# Patient Record
Sex: Female | Born: 1995
Health system: Southern US, Community
[De-identification: ages and names within clinical notes are randomized; demographics above are authoritative.]

## PROBLEM LIST (undated history)

## (undated) HISTORY — PX: TONSILLECTOMY: SUR1361

---
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
## 2015-03-12 ENCOUNTER — Other Ambulatory Visit: Payer: Self-pay | Admitting: *Deleted

## 2015-05-22 ENCOUNTER — Telehealth: Payer: Self-pay | Admitting: *Deleted

## 2015-05-22 DIAGNOSIS — Z3041 Encounter for surveillance of contraceptive pills: Secondary | ICD-10-CM

## 2015-05-22 MED ORDER — NORGESTIM-ETH ESTRAD TRIPHASIC 0.18/0.215/0.25 MG-35 MCG PO TABS: 1.0000 | ORAL_TABLET | Freq: Every day | ORAL | Status: DC

## 2015-05-22 NOTE — Telephone Encounter (Signed)
Patient is requesting a refill of Norgestimate-Ethinyl Estradiol Triphasic (TRI-SPRINTEC) 0.18/0.215/0.25 MG-35 MCG tablet [facility]

## 2015-05-22 NOTE — Telephone Encounter (Signed)
Rx done. 

## 2015-10-04 ENCOUNTER — Other Ambulatory Visit: Payer: Self-pay | Admitting: Family Medicine

## 2015-11-23 ENCOUNTER — Ambulatory Visit: Payer: Managed Care, Other (non HMO) | Admitting: Adult Health

## 2016-01-17 ENCOUNTER — Other Ambulatory Visit: Payer: Self-pay | Admitting: Family Medicine

## 2016-02-19 ENCOUNTER — Encounter: Payer: Self-pay | Admitting: Family Medicine

## 2016-02-19 ENCOUNTER — Ambulatory Visit (INDEPENDENT_AMBULATORY_CARE_PROVIDER_SITE_OTHER): Payer: Managed Care, Other (non HMO) | Admitting: Family Medicine

## 2016-02-19 VITALS — BP 90/62 | HR 114 | Temp 97.7°F | Ht 66.09 in | Wt 138.1 lb

## 2016-02-19 DIAGNOSIS — Z3041 Encounter for surveillance of contraceptive pills: Secondary | ICD-10-CM | POA: Diagnosis not present

## 2016-02-19 DIAGNOSIS — M79662 Pain in left lower leg: Secondary | ICD-10-CM

## 2016-02-19 MED ORDER — NORGESTIM-ETH ESTRAD TRIPHASIC 0.18/0.215/0.25 MG-35 MCG PO TABS: ORAL_TABLET | ORAL | Status: DC

## 2016-02-19 NOTE — Progress Notes (Signed)
  HPI:  Here for 1 year follow up. 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 Only complaint is shin pain and needs refills on OCP (see below).  Contraception Management: -doing well, taking pill daily at the same time -has not missed doses -denies: ha, HTN, concern for STI, reaction to medication -normal regular periods, FDLMP: regular, monthly -uses condoms every time, one partner, declines STI testing - no new partners -hpv vaccine: done  L shin pain: -mechanical fall on stairs a few weeks ago -bruised upper L shin with some bruising and pain initially -mostly resolve but has small bump here that is tender to press on -no pain with ambulation (now or initially), pain without pressing, swelling  ROS: See pertinent positives and negatives per HPI.  No past medical history on file.  Past Surgical History  Procedure Laterality Date  . Tonsillectomy      Family History  Problem Relation Age of Onset  . Mental retardation Father   . Hyperlipidemia Maternal Grandfather     Social History   Social History  . Marital Status: Single    Spouse Name: N/A  . Number of Children: N/A  . Years of Education: N/A   Social History Main Topics  . Smoking status: Never Smoker   . Smokeless tobacco: None  . Alcohol Use: No  . Drug Use: No  . Sexual Activity: Not Asked   Other Topics Concern  . None   Social History Narrative   Work or School: [employer], 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      Home Situation: lives with mom, dad and sister      Spiritual Beliefs: Christian      Lifestyle: dances daily; healthy diet              Current outpatient prescriptions:  .  Norgestimate-Ethinyl Estradiol Triphasic 0.18/0.215/0.25 MG-35 MCG tablet, Take 1 tablet by mouth  daily, Disp: 84 tablet, Rfl: 3  EXAM:  Filed Vitals:   02/19/16 1342  BP: 90/62  Pulse: 114  Temp: 97.7 F (36.5 C)    Body mass index is 22.22  kg/(m^2).  GENERAL: vitals reviewed and listed above, alert, oriented, appears well hydrated and in no acute distress  HEENT: atraumatic, conjunttiva clear, no obvious abnormalities on inspection of external nose and ears  NECK: no obvious masses on inspection  LUNGS: clear to auscultation bilaterally, no wheezes, rales or rhonchi, good air movement  CV: HRRR, no peripheral edema  MS: moves all extremities without noticeable abnormality; normal gait, very small area of hyperpigmentation of the skin on the superior portion of the left shin, very small subcutaneous nodule at this point, no bony deformity or tenderness, no swelling or erythema, normal exam otherwise  PSYCH: pleasant and cooperative, no obvious depression or anxiety  ASSESSMENT AND PLAN:  Discussed the following assessment and plan:  Encounter for surveillance of contraceptive pills  Pain in shin, left  -refilled OCP -discussed safe sexual practices and rationale, risks  -shin findings likely post contusion, fx unlikely so we opted to observe since pretty much asymptomatic, follow up prn -Follow up yearly and as needed -Patient advised to return or notify a doctor immediately if symptoms worsen or persist or new concerns arise.  There are no Patient Instructions on file for this visit.   Kriste BasqueKIM, Michaelanthony Kempton R.

## 2016-02-19 NOTE — Progress Notes (Signed)
Pre visit review using our clinic review tool, if applicable. No additional management support is needed unless otherwise documented below in the visit note. 

## 2016-05-12 ENCOUNTER — Ambulatory Visit (INDEPENDENT_AMBULATORY_CARE_PROVIDER_SITE_OTHER): Payer: Managed Care, Other (non HMO) | Admitting: Family Medicine

## 2016-05-12 ENCOUNTER — Encounter: Payer: Self-pay | Admitting: Family Medicine

## 2016-05-12 VITALS — BP 96/70 | HR 104 | Ht 66.1 in | Wt 143.1 lb

## 2016-05-12 DIAGNOSIS — R300 Dysuria: Secondary | ICD-10-CM

## 2016-05-12 DIAGNOSIS — J309 Allergic rhinitis, unspecified: Secondary | ICD-10-CM

## 2016-05-12 LAB — POCT URINALYSIS DIPSTICK
Blood, UA: NEGATIVE
Glucose, UA: NEGATIVE
Ketones, UA: NEGATIVE
NITRITE UA: POSITIVE
PH UA: 6
PROTEIN UA: NEGATIVE
Spec Grav, UA: >=1.03
UROBILINOGEN UA: 4

## 2016-05-12 MED ORDER — NITROFURANTOIN MONOHYD MACRO 100 MG PO CAPS: 100.0000 mg | ORAL_CAPSULE | Freq: Two times a day (BID) | ORAL | 0 refills | Status: DC

## 2016-05-12 NOTE — Patient Instructions (Addendum)
  FOR THE ALLERGIES: -zyrtec nightly -flonase 2 sprays each nostril daily for 1 month and then can do 1 spray each nostril daily -follow up in 1 month if symptoms persist, sooner if concers  Start  the antibiotic for the urinary symptoms. Follow up with your gynecologist as planned. Follow up here if persistent symptoms.

## 2016-05-12 NOTE — Progress Notes (Signed)
  HPI:  Acute visit for several issues:  1) nasal congestion: -intermittent chronically for > 1 year -other symptoms: itchy eyes, sneezing, PND -denies: fevers, thick mucus, sinus pain, wheezing, COB  2)dysuria: -for 3 days -symptoms include frequency, urgency -denies vaginal symptoms, fevers, flank pain, nausea, vomiting -hx UTI and BV -seeing gyn in 3 days  FDLMP: 04/27/16   ROS: See pertinent positives and negatives per HPI.  No past medical history on file.  Past Surgical History:  Procedure Laterality Date  . TONSILLECTOMY      Family History  Problem Relation Age of Onset  . Mental retardation Father   . Hyperlipidemia Maternal Grandfather     Social History   Social History  . Marital status: Single    Spouse name: N/A  . Number of children: N/A  . Years of education: N/A   Social History Main Topics  . Smoking status: Never Smoker  . Smokeless tobacco: None  . Alcohol use No  . Drug use: No  . Sexual activity: Not Asked   Other Topics Concern  . None   Social History Narrative   Work or School: [employer], 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      Home Situation: lives with mom, dad and sister      Spiritual Beliefs: Christian      Lifestyle: dances daily; healthy diet              Current Outpatient Prescriptions:  .  Norgestimate-Ethinyl Estradiol Triphasic 0.18/0.215/0.25 MG-35 MCG tablet, Take 1 tablet by mouth  daily, Disp: 84 tablet, Rfl: 3 .  Phenazopyridine HCl (AZO TABS PO), Take by mouth., Disp: , Rfl:  .  nitrofurantoin, macrocrystal-monohydrate, (MACROBID) 100 MG capsule, Take 1 capsule (100 mg total) by mouth 2 (two) times daily., Disp: 14 capsule, Rfl: 0  EXAM:  Vitals:   05/12/16 1356  BP: 96/70  Pulse: (!) 104    Body mass index is 23.03 kg/m.  GENERAL: vitals reviewed and listed above, alert, oriented, appears well hydrated and in no acute distress  HEENT: atraumatic, conjunttiva clear, no  obvious abnormalities on inspection of external nose and ears, normal appearance of ear canals and TMs, clear nasal congestion, boggy turbinates, mild post oropharyngeal erythema with PND, no tonsillar edema or exudate, no sinus TTP  NECK: no obvious masses on inspection  LUNGS: clear to auscultation bilaterally, no wheezes, rales or rhonchi, good air movement  ABD: BS+, soft, NTTP, no CVA TTP  CV: HRRR, no peripheral edema  MS: moves all extremities without noticeable abnormality  PSYCH: pleasant and cooperative, no obvious depression or anxiety  ASSESSMENT AND PLAN:  Discussed the following assessment and plan:  Allergic rhinitis, unspecified allergic rhinitis type  Dysuria - Plan: POC Urinalysis Dipstick  -allergy tx per instructions -opted for macrobid for UTI after discussion risks/alt/return precuations -Patient advised to return or notify a doctor immediately if symptoms worsen or persist or new concerns arise.  Patient Instructions   FOR THE ALLERGIES: -zyrtec nightly -flonase 2 sprays each nostril daily for 1 month and then can do 1 spray each nostril daily -follow up in 1 month if symptoms persist, sooner if concers  Start  the antibiotic for the urinary symptoms. Follow up with your gynecologist as planned. Follow up here if persistent symptoms.    Kriste BasqueKIM, Mickenzie Stolar R., DO

## 2016-05-12 NOTE — Progress Notes (Signed)
Pre visit review using our clinic review tool, if applicable. No additional management support is needed unless otherwise documented below in the visit note. 

## 2016-06-19 ENCOUNTER — Other Ambulatory Visit: Payer: Self-pay | Admitting: Family Medicine

## 2017-02-27 ENCOUNTER — Ambulatory Visit (INDEPENDENT_AMBULATORY_CARE_PROVIDER_SITE_OTHER): Payer: Managed Care, Other (non HMO) | Admitting: Family Medicine

## 2017-02-27 ENCOUNTER — Encounter: Payer: Managed Care, Other (non HMO) | Admitting: Family Medicine

## 2017-02-27 ENCOUNTER — Encounter: Payer: Self-pay | Admitting: Family Medicine

## 2017-02-27 VITALS — BP 88/60 | HR 85 | Temp 97.6°F | Ht 66.25 in | Wt 132.6 lb

## 2017-02-27 DIAGNOSIS — Z0000 Encounter for general adult medical examination without abnormal findings: Secondary | ICD-10-CM

## 2017-02-27 DIAGNOSIS — Z111 Encounter for screening for respiratory tuberculosis: Secondary | ICD-10-CM

## 2017-02-27 NOTE — Progress Notes (Signed)
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 No tb exposure risks or symptoms.  -Diet: variety of foods, balance and well rounded  -Exercise: regular exercise  -Taking folic acid, vitamin D or calcium: no  -Diabetes and Dyslipidemia Screening:n/a  -Hx of HTN: no  -Vaccines: UTD  -pap history: n/a - sees gyn   -wants STI testing (Hep C if born [birth year]): no  -FH breast, colon or ovarian ca: see FH Last mammogram: Last colon cancer screening:  -Alcohol, Tobacco, drug use: see social history  Review of Systems - no fevers, unintentional weight loss, vision loss, hearing loss, chest pain, sob, hemoptysis, melena, hematochezia, hematuria, genital discharge, changing or concerning skin lesions, bleeding, bruising, loc, thoughts of self harm or SI  No past medical history on file.  Past Surgical History:  Procedure Laterality Date  . TONSILLECTOMY      Family History  Problem Relation Age of Onset  . Mental retardation Father   . Hyperlipidemia Maternal Grandfather     Social History   Social History  . Marital status: Single    Spouse name: N/A  . Number of children: N/A  . Years of education: N/A   Social History Main Topics  . Smoking status: Never Smoker  . Smokeless tobacco: Never Used  . Alcohol use No  . Drug use: No  . Sexual activity: Not Asked   Other Topics Concern  . 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  etonogestrel (NEXPLANON) 68 MG IMPL implant, 1 each by Subdermal route once. Implanted 05/22/2016 by [facility], Disp: , Rfl:   EXAM:  Vitals:   02/27/17 0916  BP: (!) 88/60  Pulse: 85   Temp: 97.6 F (36.4 C)    GENERAL: vitals reviewed and listed below, alert, oriented, appears well hydrated and in no acute distress  HEENT: head atraumatic, PERRLA, normal appearance of eyes, ears, nose and mouth. moist mucus membranes.  NECK: supple, no masses or lymphadenopathy  LUNGS: clear to auscultation bilaterally, no rales, rhonchi or wheeze  CV: HRRR, no peripheral edema or cyanosis, normal pedal pulses  ABDOMEN: bowel sounds normal, soft, non tender to palpation, no masses, no rebound or guarding  SKIN: no rash or abnormal lesions  GYN/BREAST: declined  MS: normal gait, moves all extremities normally  NEURO: normal gait, speech and thought processing grossly intact, muscle tone grossly intact throughout  PSYCH: normal affect, pleasant and cooperative  ASSESSMENT AND PLAN:  Discussed the following assessment and plan:  Screening-pulmonary TB - Plan: Quantiferon tb gold assay  Encounter for preventive health examination  -forms given to assistant to copy for patient  -she opted for quant gold TB screening as can not return for skin test  -Discussed and advised all [place] preventive services health task force level A and B recommendations for age, sex and risks.  -Advised at least 150 minutes of exercise per week and a healthy diet with avoidance of (less then 1 serving per week) processed foods, white starches, red meat, fast foods and sweets and  consisting of: * 5-9 servings of fresh fruits and vegetables (not corn or potatoes) *nuts and seeds, beans *olives and olive oil *lean meats such as fish and white chicken  *whole grains  -labs, studies and vaccines per orders this encounter  Orders Placed This Encounter  Procedures  . Quantiferon tb gold assay    Patient advised to return to clinic immediately if symptoms worsen or persist or new concerns.  Patient Instructions  BEFORE YOU LEAVE: -lab for TB test -vaccine record and forms -follow up: yearly  for CPE  We have ordered labs or studies at this visit. It can take up to 1-2 weeks for results and processing. IF results require follow up or explanation, we will call you with instructions. Clinically stable results will be released to your [facility]MYCHART. If you have not heard from us or cannot find your results in [facility] in 2 weeks please contact our office at [phone].  If you are not yet signed up for [facility], please consider signing up.   We recommend the following healthy lifestyle for LIFE:  Eat a healthy clean diet.  * Tip: Avoid (less then 1 serving per week): processed foods, sweets, sweetened drinks, white starches (rice, flour, bread, potatoes, pasta, etc), red meat, fast foods, butter  *Tip: CHOOSE instead   * 5-9 servings per day of fresh or frozen fruits and vegetables (but not corn, potatoes, bananas, canned or dried fruit)   *nuts and seeds, beans   *olives and olive oil   *small portions of lean meats such as fish and white chicken    *small portions of whole grains  Get at least 150 minutes of sweaty aerobic exercise per week.  Reduce stress - consider counseling, meditation and relaxation to balance other aspects of your life.         No Follow-up on file.  Kriste BasqueKIM, Demarko Zeimet R., DO

## 2017-02-27 NOTE — Patient Instructions (Signed)
BEFORE YOU LEAVE: -lab for TB test -vaccine record and forms -follow up: yearly for CPE  We have ordered labs or studies at this visit. It can take up to 1-2 weeks for results and processing. IF results require follow up or explanation, we will call you with instructions. Clinically stable results will be released to your [facility]. If you have not heard from us or cannot find your results in [facility] in 2 weeks please contact our office at [phone].  If you are not yet signed up for [facility], please consider signing up.   We recommend the following healthy lifestyle for LIFE:  Eat a healthy clean diet.  * Tip: Avoid (less then 1 serving per week): processed foods, sweets, sweetened drinks, white starches (rice, flour, bread, potatoes, pasta, etc), red meat, fast foods, butter  *Tip: CHOOSE instead   * 5-9 servings per day of fresh or frozen fruits and vegetables (but not corn, potatoes, bananas, canned or dried fruit)   *nuts and seeds, beans   *olives and olive oil   *small portions of lean meats such as fish and white chicken    *small portions of whole grains  Get at least 150 minutes of sweaty aerobic exercise per week.  Reduce stress - consider counseling, meditation and relaxation to balance other aspects of your life.

## 2017-03-03 LAB — QUANTIFERON TB GOLD ASSAY (BLOOD)
INTERFERON GAMMA RELEASE ASSAY: NEGATIVE
Mitogen-Nil: 9.83 IU/mL
Quantiferon Nil Value: 0.04 IU/mL

## 2017-06-25 ENCOUNTER — Encounter: Payer: Self-pay | Admitting: Family Medicine

## 2017-10-15 ENCOUNTER — Encounter: Payer: Self-pay | Admitting: Family Medicine

## 2019-05-24 ENCOUNTER — Other Ambulatory Visit: Payer: Self-pay

## 2019-05-24 ENCOUNTER — Telehealth (INDEPENDENT_AMBULATORY_CARE_PROVIDER_SITE_OTHER): Payer: Managed Care, Other (non HMO) | Admitting: Family Medicine

## 2019-05-24 DIAGNOSIS — R21 Rash and other nonspecific skin eruption: Secondary | ICD-10-CM

## 2019-05-24 MED ORDER — TRIAMCINOLONE ACETONIDE 0.1 % EX CREA: 1.0000 "application " | TOPICAL_CREAM | Freq: Two times a day (BID) | CUTANEOUS | 0 refills | Status: DC

## 2019-05-24 MED ORDER — VALACYCLOVIR HCL 1 G PO TABS: 1000.0000 mg | ORAL_TABLET | Freq: Every day | ORAL | 0 refills | Status: DC

## 2019-05-24 NOTE — Patient Instructions (Signed)
-  gentle skin care - use hypoallergenic soap, detergent, etc and avoid heat and sun exposure if possible  -use Aquaphor twice daily mix with a little of the triamcinilone cream for 2 weeks.  I hope you are feeling better soon! Seek care promptly if your symptoms worsen, new concerns arise or you are not improving with treatment over the next week or so.

## 2019-05-24 NOTE — Progress Notes (Signed)
Virtual Visit via Video Note  I connected with Gina Sanders  on 05/24/19 at  3:00 PM EDT by a video enabled telemedicine application and verified that I am speaking with the correct person using two identifiers.  Location patient: home Location provider:work or home office Persons participating in the virtual visit: patient, provider  I discussed the limitations of evaluation and management by telemedicine and the availability of in person appointments. The patient expressed understanding and agreed to proceed.   HPI:  Acute visit for a rash: -started about 4-5 days ago -just noticed it in the shower - skin is a little dray and has very fine papules on the sides and back and arms -only new exposure she can think of is Excedrin, but has used before without an issue -very mildly itchy -no others symptoms, sickness or exposures -has hx of sensitive skin  ROS: See pertinent positives and negatives per HPI.  No past medical history on file.  Past Surgical History:  Procedure Laterality Date  . TONSILLECTOMY      Family History  Problem Relation Age of Onset  . Mental retardation Father   . Hyperlipidemia Maternal Grandfather     SOCIAL HX: see hpi   Current Outpatient Medications:  .  etonogestrel (NEXPLANON) 68 MG IMPL implant, 1 each by Subdermal route once. Implanted 05/22/2016 by [facility], Disp: , Rfl:  .  triamcinolone cream (KENALOG) 0.1 %, Apply 1 application topically 2 (two) times daily., Disp: 45 g, Rfl: 0 .  valACYclovir (VALTREX) 1000 MG tablet, Take 1 tablet (1,000 mg total) by mouth daily., Disp: 30 tablet, Rfl: 0  EXAM:  VITALS per patient if applicable:  GENERAL: alert, oriented, appears well and in no acute distress  HEENT: atraumatic, conjunttiva clear, no obvious abnormalities on inspection of external nose and ears  NECK: normal movements of the head and neck  LUNGS: on inspection no signs of respiratory distress, breathing rate appears normal, no  obvious gross SOB, gasping or wheezing  CV: no obvious cyanosis  SKIN: ? Of barely visible finely papular rash on the flanks, dry skin  MS: moves all visible extremities without noticeable abnormality  PSYCH/NEURO: pleasant and cooperative, no obvious depression or anxiety, speech and thought processing grossly intact  ASSESSMENT AND PLAN:  Discussed the following assessment and plan:  Rash   -we discussed possible serious and likely etiologies, workup and treatment, treatment risks and return precautions; suspect possible atopic dermatitis or eczematous rash -after this discussion, Gina Sanders opted for trial gentle skin care, good emollient (aquafor), steroid topical, antihistamine if needed  -follow up advised  if symptoms worsen or persist or new concerns arise.   I discussed the assessment and treatment plan with the patient. The patient was provided an opportunity to ask questions and all were answered. The patient agreed with the plan and demonstrated an understanding of the instructions.   Lucretia Kern, DO   Patient Instructions  -gentle skin care - use hypoallergenic soap, detergent, etc and avoid heat and sun exposure if possible  -use Aquaphor twice daily mix with a little of the triamcinilone cream for 2 weeks.  I hope you are feeling better soon! Seek care promptly if your symptoms worsen, new concerns arise or you are not improving with treatment over the next week or so.

## 2019-09-15 ENCOUNTER — Other Ambulatory Visit: Payer: Self-pay

## 2019-09-15 ENCOUNTER — Telehealth (INDEPENDENT_AMBULATORY_CARE_PROVIDER_SITE_OTHER): Payer: Self-pay | Admitting: Family Medicine

## 2019-09-15 DIAGNOSIS — F410 Panic disorder [episodic paroxysmal anxiety] without agoraphobia: Secondary | ICD-10-CM

## 2019-09-15 DIAGNOSIS — F439 Reaction to severe stress, unspecified: Secondary | ICD-10-CM

## 2019-09-15 DIAGNOSIS — F411 Generalized anxiety disorder: Secondary | ICD-10-CM

## 2019-09-15 MED ORDER — FLUOXETINE HCL 20 MG PO TABS: 20.0000 mg | ORAL_TABLET | Freq: Every day | ORAL | 3 refills | Status: DC

## 2019-09-15 NOTE — Progress Notes (Signed)
Virtual Visit via Video Note  I connected with Gina Sanders  on 09/15/19 at  3:20 PM EST by a video enabled telemedicine application and verified that I am speaking with the correct person using two identifiers.  Location patient: home Location provider:work or home office Persons participating in the virtual visit: patient, provider  I discussed the limitations of evaluation and management by telemedicine and the availability of in person appointments. The patient expressed understanding and agreed to proceed.   HPI:  Acute visit for Anxiety: -intense amount of anxiety the last few months with the COVID pandemic -she actually started seeing a therapist several years ago and saw her for several years, but symptoms have worsened this year -main symptoms are generalized anxiety and some panic attacks -has had some depression sometimes with this, she has had some spells where she feels unmotivated and down for a day or two -she has a few panic attacks per month - very rarely are they bad attacks -a lot of worry about COVID pandemic - worried about getting covid and giving it to someone -isolation during the pandemic has also been tough because she likes to be around people -she does feel like it is impacting relationships and her life -had a panic attack last night, her parents are very supportive -she is living in [place] -denies thoughts of harm, manic symptoms, hallucinations -denies pregnancy, on nexplanon, denies chance of pregnacy  ROS: See pertinent positives and negatives per HPI.  No past medical history on file.  Past Surgical History:  Procedure Laterality Date  . TONSILLECTOMY      Family History  Problem Relation Age of Onset  . Mental retardation Father   . Hyperlipidemia Maternal Grandfather     SOCIAL HX: see hpi   Current Outpatient Medications:  .  etonogestrel (NEXPLANON) 68 MG IMPL implant, 1 each by Subdermal route once. Implanted 05/22/2016 by [facility], Disp: , Rfl:  .  FLUoxetine (PROZAC) 20 MG tablet, Take 1 tablet (20 mg total) by mouth daily., Disp: 30 tablet, Rfl: 3 .  triamcinolone cream (KENALOG) 0.1 %, Apply 1 application topically 2 (two) times daily., Disp: 45 g, Rfl: 0 .  valACYclovir (VALTREX) 1000 MG tablet, Take 1 tablet (1,000 mg total) by mouth daily., Disp: 30 tablet, Rfl: 0  EXAM:  VITALS per patient if applicable:  GENERAL: alert, oriented, appears well and in no acute distress  HEENT: atraumatic, conjunttiva clear, no obvious abnormalities on inspection of external nose and ears  NECK: normal movements of the head and neck  LUNGS: on inspection no signs of respiratory distress, breathing rate appears normal, no obvious gross SOB, gasping or wheezing  CV: no obvious cyanosis  MS: moves all visible extremities without noticeable abnormality  PSYCH/NEURO: pleasant and cooperative, no obvious depression or anxiety, speech and thought processing grossly intact  ASSESSMENT AND PLAN:  Discussed the following assessment and plan:  GAD (generalized anxiety disorder)  Stress  Panic disorder  -we discussed possible serious and likely etiologies, options for evaluation and workup, limitations of telemedicine visit vs in person visit, treatment, treatment risks and precautions. Pt prefers to treat via telemedicine empirically rather then risking or undertaking an in person visit at this moment. She is going to consider getting back in for CBT which I highly advised and made her aware of virtual options through [place]. She wants to try a medication and opted for Prozac 20mg  daily after a lengthy discussion of risks. Follow up 1 month. She is in  the process of finding a PCP. Patient agrees to seek prompt in person care if worsening, new symptoms arise, or if is not improving with treatment.   I discussed the assessment and treatment plan with the patient. The patient was provided an opportunity to ask questions and  all were answered. The patient agreed with the plan and demonstrated an understanding of the instructions.   The patient was advised to call back or seek an in-person evaluation if the symptoms worsen or if the condition fails to improve as anticipated.   Terressa Koyanagi, DO

## 2019-09-15 NOTE — Patient Instructions (Signed)
-  I sent the medication(s) we discussed to your pharmacy: Meds ordered this encounter  Medications  . FLUoxetine (PROZAC) 20 MG tablet    Sig: Take 1 tablet (20 mg total) by mouth daily.    Dispense:  30 tablet    Refill:  3    Please let us know if you have any questions or concerns regarding this prescription.  Follow up in 1 month  I hope you are feeling better soon! Seek care promptly if your symptoms worsen, new concerns arise or you are not improving with treatment.

## 2019-09-20 ENCOUNTER — Telehealth: Payer: Self-pay

## 2019-09-20 NOTE — Telephone Encounter (Signed)
Copied from [place] [id number]. Topic: Quick Communication - See Telephone Encounter >> Sep 20, 2019  4:57 PM Loma Boston wrote: CRM for notification. See Telephone encounter for: 09/20/19. Pt returned call was not doced pls call her back at this # if needed [phone]

## 2019-09-21 ENCOUNTER — Other Ambulatory Visit: Payer: Self-pay | Admitting: Family Medicine

## 2019-10-07 ENCOUNTER — Other Ambulatory Visit: Payer: Self-pay | Admitting: Family Medicine

## 2019-12-04 ENCOUNTER — Other Ambulatory Visit: Payer: Self-pay | Admitting: Family Medicine

## 2020-01-02 ENCOUNTER — Other Ambulatory Visit: Payer: Self-pay | Admitting: Family Medicine

## 2020-01-28 ENCOUNTER — Other Ambulatory Visit: Payer: Self-pay | Admitting: Family Medicine

## 2020-03-03 ENCOUNTER — Other Ambulatory Visit: Payer: Self-pay | Admitting: Family Medicine

## 2020-03-26 ENCOUNTER — Telehealth: Payer: Self-pay | Admitting: Family Medicine

## 2020-03-26 NOTE — Telephone Encounter (Signed)
Pt is needing the following vaccines: tetanus, measles, varicella, hep b and tb test. Pt is going to college and needs vaccines. Pt would also, like to know if she will need any other vaccines as well? Thanks

## 2020-03-26 NOTE — Telephone Encounter (Signed)
I called the pt and scheduled a transfer of care visit with Dr [place] on 6/29 at 7am.  Patient was advised to discuss this with Dr [place] during this visit.

## 2020-04-03 ENCOUNTER — Ambulatory Visit (INDEPENDENT_AMBULATORY_CARE_PROVIDER_SITE_OTHER): Payer: Managed Care, Other (non HMO) | Admitting: Family Medicine

## 2020-04-03 ENCOUNTER — Telehealth: Payer: Self-pay

## 2020-04-03 ENCOUNTER — Encounter: Payer: Self-pay | Admitting: Family Medicine

## 2020-04-03 ENCOUNTER — Other Ambulatory Visit: Payer: Self-pay

## 2020-04-03 VITALS — BP 118/70 | HR 95 | Temp 98.2°F | Resp 12 | Ht 66.25 in | Wt 158.0 lb

## 2020-04-03 DIAGNOSIS — F419 Anxiety disorder, unspecified: Secondary | ICD-10-CM | POA: Insufficient documentation

## 2020-04-03 DIAGNOSIS — G4700 Insomnia, unspecified: Secondary | ICD-10-CM | POA: Diagnosis not present

## 2020-04-03 DIAGNOSIS — F902 Attention-deficit hyperactivity disorder, combined type: Secondary | ICD-10-CM | POA: Diagnosis not present

## 2020-04-03 DIAGNOSIS — Z23 Encounter for immunization: Secondary | ICD-10-CM

## 2020-04-03 DIAGNOSIS — Z0000 Encounter for general adult medical examination without abnormal findings: Secondary | ICD-10-CM | POA: Diagnosis not present

## 2020-04-03 DIAGNOSIS — Z111 Encounter for screening for respiratory tuberculosis: Secondary | ICD-10-CM | POA: Diagnosis not present

## 2020-04-03 MED ORDER — LISDEXAMFETAMINE DIMESYLATE 20 MG PO CAPS: 20.0000 mg | ORAL_CAPSULE | Freq: Every day | ORAL | 0 refills | Status: DC

## 2020-04-03 NOTE — Telephone Encounter (Signed)
PA was started if denied will send to PCP to review possible sub. ( Guanfacine 1 mg tab24) which is covered by pt insurance.

## 2020-04-03 NOTE — Progress Notes (Signed)
HPI: Gina Sanders is a 24 y.o. female, who is here today to establish care.  Former PCP: Dr Selena Batten Last preventive routine visit: 02/27/17.  Chronic medical problems: Anxiety and genital herpes.  She needs to have a CPE. She sees gyn regularly for her female preventive care. Negative for high alcohol intake, she drinks during weekends. No hx of tobacco or illicit drug use. She lives with 2 roommates.  She does not exercise regularly but has an active job. In general she follows a healthful diet.  Needs a form completed, attends school and [facility] [place] program.  -Today she also would like to discuss possible ADHD dx. No prior Hx. FHX negative for ADHD. Father suffers depression and anxiety.  Anxiety, she is on Fluoxetine 40 mg daily,dose increased about 6 months ago. She was doing on line counseling but provider did not want to discuss ADHD symptoms. Negative for depression.  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  It takes her a lot to complete tasks, always waiting until the last minute to compete homework.  Grade in high school and college were good otherwise. Stayed late at night to complete assignments.  She is concerned that problem is going to affect performance in pot graduate school. "Emotional" and frustrated because easily distracted.   +Insomnia.  Review of Systems  Constitutional: Positive for fatigue. Negative for appetite change and fever.  HENT: Negative for dental problem, hearing loss, mouth sores and sore throat.   Eyes: Negative for redness and visual disturbance.  Respiratory: Negative for cough, shortness of breath and wheezing.   Cardiovascular: Negative for chest pain and leg swelling.  Gastrointestinal: Negative for abdominal pain, nausea and vomiting.       No changes in bowel habits.  Endocrine: Negative for cold intolerance, heat intolerance, polydipsia, polyphagia and polyuria.  Genitourinary: Negative for decreased  urine volume, dysuria, hematuria, vaginal bleeding and vaginal discharge.  Musculoskeletal: Negative for arthralgias, gait problem and myalgias.  Skin: Negative for color change and rash.  Allergic/Immunologic: Negative for environmental allergies.  Neurological: Negative for syncope, weakness and headaches.  Hematological: Negative for adenopathy. Does not bruise/bleed easily.  Psychiatric/Behavioral: Positive for sleep disturbance. Negative for confusion and hallucinations. The patient is nervous/anxious.   All other systems reviewed and are negative.  Current Outpatient Medications on File Prior to Visit  Medication Sig Dispense Refill  . etonogestrel (NEXPLANON) 68 MG IMPL implant 1 each by Subdermal route once. Implanted 05/22/2016 by [facility]    . FLUoxetine (PROZAC) 20 MG tablet TAKE 1 TABLET BY MOUTH EVERY DAY 90 tablet 0  . valACYclovir (VALTREX) 500 MG tablet Take 500 mg by mouth daily.     No current facility-administered medications on file prior to visit.   History reviewed. No pertinent past medical history. Allergies  Allergen Reactions  . Sulfa Antibiotics     Family History  Problem Relation Age of Onset  . Mental retardation Father   . Hyperlipidemia Maternal Grandfather     Social History   Socioeconomic History  . Marital status: Single    Spouse name: Not on file  . Number of children: Not on file  . Years of education: Not on file  . Highest education level: Not on file  Occupational History  . Not on file  Tobacco Use  . Smoking status: Never Smoker  . Smokeless tobacco: Never Used  Substance and Sexual Activity  . Alcohol use: No  . Drug use: No  .  Sexual activity: Not on file  Other Topics Concern  . 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 Difficulty of Paying Living Expenses:   Food Insecurity:   . Worried About Programme researcher, broadcasting/film/video in the Last Year:   . Barista in the Last Year:   Transportation Needs:   . Freight forwarder (Medical):   Marland Kitchen Lack of Transportation (Non-Medical):   Physical Activity:   . Days of Exercise per Week:   . Minutes of Exercise per Session:   Stress:   . Feeling of Stress :   Social Connections:   . Frequency of Communication with Friends and Family:   . Frequency of Social Gatherings with Friends and Family:   . Attends Religious Services:   . Active Member of Clubs or Organizations:   . Attends Banker Meetings:   Marland Kitchen Marital Status:     Vitals:   04/03/20 0707  BP: 118/70  Pulse: 95  Resp: 12  Temp: 98.2 F (36.8 C)  SpO2: 98%   Body mass index is 25.31 kg/m.  Physical Exam Vitals and nursing note reviewed.  Constitutional:      General: She is not in acute distress.    Appearance: She is well-developed.  HENT:     Head: Normocephalic and atraumatic.     Right Ear: Hearing, tympanic membrane, ear canal and external ear normal.     Left Ear: Hearing, tympanic membrane, ear canal and external ear normal.     Mouth/Throat:     Pharynx: Uvula midline.  Eyes:     Conjunctiva/sclera: Conjunctivae normal.     Pupils: Pupils are equal, round, and reactive to light.  Neck:     Thyroid: No thyromegaly.     Trachea: No tracheal deviation.  Cardiovascular:     Rate and Rhythm: Normal rate and regular rhythm.     Pulses:          Dorsalis pedis pulses are 2+ on the right side and 2+ on the left side.       Posterior tibial pulses are 2+ on the right side and 2+ on the left side.     Heart sounds: No murmur heard.   Pulmonary:     Effort: Pulmonary effort is normal. No respiratory distress.     Breath sounds: Normal breath sounds.  Abdominal:      Palpations: Abdomen is soft. There is no hepatomegaly or mass.     Tenderness: There is no abdominal tenderness.  Genitourinary:    Comments: Deferred to gyn. Musculoskeletal:     Comments: No major deformity or signs of synovitis appreciated.  Lymphadenopathy:     Cervical: No cervical adenopathy.     Upper Body:     Right upper body: No supraclavicular adenopathy.     Left upper body: No supraclavicular adenopathy.  Skin:    General: Skin is warm.     Findings: No erythema or rash.  Neurological:     Mental Status: She is alert and oriented to person, place, and time.  Cranial Nerves: No cranial nerve deficit.     Coordination: Coordination normal.     Gait: Gait normal.     Deep Tendon Reflexes:     Reflex Scores:      Bicep reflexes are 2+ on the right side and 2+ on the left side.      Patellar reflexes are 2+ on the right side and 2+ on the left side. Psychiatric:        Speech: Speech normal.     Comments: Well groomed, good eye contact.    ASSESSMENT AND PLAN:  Esha was seen today for annual exam and anxiety.  Diagnoses and all orders for this visit:  Routine general medical examination at a health care facility We discussed the importance of regular physical activity and healthy diet for prevention of chronic illness and/or complications. Preventive guidelines reviewed. Continue female preventive care with her gyn.  Vaccination updated. School form completed and signs. She can print TB test result and attach to form. Next CPE in a year.  Attention deficit hyperactivity disorder (ADHD), combined type We discussed pharmacologic treatments. She agrees with trying Vyvanse. We discussed side effects. F/U in 3-4 weeks,beore if needed.  -     lisdexamfetamine (VYVANSE) 20 MG capsule; Take 1 capsule (20 mg total) by mouth daily.  Screening-pulmonary TB -     QuantiFERON-TB Gold Plus  Anxiety disorder, unspecified type Otherwise well controlled. No changes  in Fluoxetine dose.  Insomnia, unspecified type Vyvanse may aggravate problem. Good sleep hygiene. OTC Melatonin 2-3 hours before bedtime may help.  Need for Tdap vaccination -     Tdap vaccine greater than or equal to 7yo IM  Other orders -     TIQ-NTM   Return in about 3 weeks (around 04/24/2020) for adhd med.   Erminia Mcnew G. [place], MD  [facility]. [place] office.    A few things to remember from today's visit:   Screening-pulmonary TB - Plan: QuantiFERON-TB Gold Plus  Anxiety disorder, unspecified type  Insomnia, unspecified type  Today we are starting Vyvanse. I will see you back in 3 weeks.  If you need refills please call your pharmacy. Do not use My Chart to request refills or for acute issues that need immediate attention.   At least 150 minutes of moderate exercise per week, daily brisk walking for 15-30 min is a good exercise option. Healthy diet low in saturated (animal) fats and sweets and consisting of fresh fruits and vegetables, lean meats such as fish and white chicken and whole grains.  These are some of recommendations for screening depending of age and risk factors:  - Vaccines:  Tdap vaccine every 10 years.  Shingles vaccine recommended at age 17, could be given after 24 years of age but not sure about insurance coverage.   Pneumonia vaccines: Pneumovax at 65. Sometimes Pneumovax is giving earlier if history of smoking, lung disease,diabetes,kidney disease among some.  Screening for diabetes at age 70 and every 3 years.  Cervical cancer prevention:  Pap smear starts at 24 years of age and continues periodically until 24 years old in low risk women. Pap smear every 3 years between 71 and 34 years old. Pap smear every 3-5 years between women 30 and older if pap smear negative and HPV screening negative.   -Breast cancer: Mammogram: There is disagreement between experts about when to start screening in low risk asymptomatic female  but recent recommendations are to start screening at 40 and not later than  24 years old , every 1-2 years and after 24 yo q 2 years. Screening is recommended until 24 years old but some women can continue screening depending of healthy issues.  Colon cancer screening: Has been recently changed to 24 yo. Insurance may not cover until you are 24 years old. Screening is recommended until 24 years old.  Cholesterol disorder screening at age 24 and every 3 years.  Also recommended:  1. Dental visit- Brush and floss your teeth twice daily; visit your dentist twice a year. 2. Eye doctor- Get an eye exam at least every 2 years. 3. Helmet use- Always wear a helmet when riding a bicycle, motorcycle, rollerblading or skateboarding. 4. Safe sex- If you may be exposed to sexually transmitted infections, use a condom. 5. Seat belts- Seat belts can save your live; always wear one. 6. Smoke/Carbon Monoxide detectors- These detectors need to be installed on the appropriate level of your home. Replace batteries at least once a year. 7. Skin cancer- When out in the sun please cover up and use sunscreen 15 SPF or higher. 8. Violence- If anyone is threatening or hurting you, please tell your healthcare provider.  9. Drink alcohol in moderation- Limit alcohol intake to one drink or less per day. Never drink and drive. 10. Calcium supplementation 1000 to 1200 mg daily, ideally through your diet.  Vitamin D supplementation 800 units daily.

## 2020-04-03 NOTE — Patient Instructions (Addendum)
  A few things to remember from today's visit:   Screening-pulmonary TB - Plan: QuantiFERON-TB Gold Plus  Anxiety disorder, unspecified type  Insomnia, unspecified type  Today we are starting Vyvanse. I will see you back in 3 weeks.  If you need refills please call your pharmacy. Do not use My Chart to request refills or for acute issues that need immediate attention.   At least 150 minutes of moderate exercise per week, daily brisk walking for 15-30 min is a good exercise option. Healthy diet low in saturated (animal) fats and sweets and consisting of fresh fruits and vegetables, lean meats such as fish and white chicken and whole grains.  These are some of recommendations for screening depending of age and risk factors:  - Vaccines:  Tdap vaccine every 10 years.  Shingles vaccine recommended at age 49, could be given after 24 years of age but not sure about insurance coverage.   Pneumonia vaccines: Pneumovax at 65. Sometimes Pneumovax is giving earlier if history of smoking, lung disease,diabetes,kidney disease among some.  Screening for diabetes at age 93 and every 3 years.  Cervical cancer prevention:  Pap smear starts at 24 years of age and continues periodically until 24 years old in low risk women. Pap smear every 3 years between 65 and 85 years old. Pap smear every 3-5 years between women 30 and older if pap smear negative and HPV screening negative.   -Breast cancer: Mammogram: There is disagreement between experts about when to start screening in low risk asymptomatic female but recent recommendations are to start screening at 3 and not later than 24 years old , every 1-2 years and after 24 yo q 2 years. Screening is recommended until 24 years old but some women can continue screening depending of healthy issues.  Colon cancer screening: Has been recently changed to 24 yo. Insurance may not cover until you are 24 years old. Screening is recommended until 24 years  old.  Cholesterol disorder screening at age 42 and every 3 years.  Also recommended:  1. Dental visit- Brush and floss your teeth twice daily; visit your dentist twice a year. 2. Eye doctor- Get an eye exam at least every 2 years. 3. Helmet use- Always wear a helmet when riding a bicycle, motorcycle, rollerblading or skateboarding. 4. Safe sex- If you may be exposed to sexually transmitted infections, use a condom. 5. Seat belts- Seat belts can save your live; always wear one. 6. Smoke/Carbon Monoxide detectors- These detectors need to be installed on the appropriate level of your home. Replace batteries at least once a year. 7. Skin cancer- When out in the sun please cover up and use sunscreen 15 SPF or higher. 8. Violence- If anyone is threatening or hurting you, please tell your healthcare provider.  9. Drink alcohol in moderation- Limit alcohol intake to one drink or less per day. Never drink and drive. 10. Calcium supplementation 1000 to 1200 mg daily, ideally through your diet.  Vitamin D supplementation 800 units daily.

## 2020-04-03 NOTE — Telephone Encounter (Signed)
Noted  

## 2020-04-03 NOTE — Telephone Encounter (Signed)
Tried to start a PA before I could after entering PA key for pt this note popped up.    Gina Sanders Key: [id number]  help_outline NEED HELP? [phone] error The pharmacy claim for Vyvanse 20MG  capsules has been rejected by insurance. Non-preferred medications may have a higher patient co-pay than the health insurance plan's preferred medications. Using available formulary data, we believe that the following medications may be covered. Drug Name  PA Requirement Guanfacine 1 Mg Tb24 NOT Required*   *Still will try to do a PA*

## 2020-04-03 NOTE — Telephone Encounter (Signed)
Patient called back and said if Prior authorization is not approval she is fine about switching medication. She would like a call to know the status of her authorization

## 2020-04-03 NOTE — Telephone Encounter (Signed)
Essie Christine Key: Sherley Bounds - PA Case ID: [id number] - Rx #: [id number] Need help? Call us at [phone] Status Sent to [facility] Vyvanse 20MG  capsules Form [facility] Electronic Prior Authorization Form (2017 NCPDP) Original Claim Info 2 Dr. 61 ePA at [email or web address] Requires Prior Authorization

## 2020-04-03 NOTE — Assessment & Plan Note (Signed)
We reviewed diagnosis criteria. We discussed pharmacologic treatment options as well as side effects. She agrees with trying Vyvanse 20 mg daily. Follow-up in 3 weeks.

## 2020-04-03 NOTE — Telephone Encounter (Signed)
Pt medication has been approved. Tried to call pt on both numbers but no answer. Lm for pt to return call for update.

## 2020-04-03 NOTE — Telephone Encounter (Signed)
Gina Sanders Key: Sherley Bounds - PA Case ID: [id number] - Rx #: [id number] Need help? Call us at [phone] Outcome Approvedtoday Request Reference Number: [id number]. VYVANSE CAP 20MG  is approved through 04/03/2021. Your patient may now fill this prescription and it will be covered. Drug Vyvanse 20MG  capsules Form [facility] Electronic Prior Authorization Form (2017 NCPDP) Original Claim Info 50 Dr. 07-26-2005 ePA at [email or web address] Requires Prior Authorization

## 2020-04-03 NOTE — Telephone Encounter (Signed)
Pt notified of update and verbalized understanding. No further action needed! 

## 2020-04-03 NOTE — Assessment & Plan Note (Signed)
Problem does not seem to be well controlled. For now continue fluoxetine 40 mg daily, will reevaluate next visit. CBT may also help.

## 2020-04-05 ENCOUNTER — Encounter: Payer: Self-pay | Admitting: Family Medicine

## 2020-04-09 ENCOUNTER — Encounter: Payer: Self-pay | Admitting: Family Medicine

## 2020-04-09 DIAGNOSIS — Z0184 Encounter for antibody response examination: Secondary | ICD-10-CM

## 2020-04-10 ENCOUNTER — Ambulatory Visit: Payer: Self-pay | Admitting: Family Medicine

## 2020-04-11 LAB — TIQ-NTM

## 2020-04-11 LAB — QUANTIFERON-TB GOLD PLUS
Mitogen-NIL: 0.01 IU/mL
NIL: 0.07 IU/mL
QuantiFERON-TB Gold Plus: UNDETERMINED — AB
TB1-NIL: <0 IU/mL
TB2-NIL: 0 IU/mL

## 2020-04-19 ENCOUNTER — Encounter: Payer: Self-pay | Admitting: Family Medicine

## 2020-05-01 ENCOUNTER — Encounter: Payer: Self-pay | Admitting: Family Medicine

## 2020-05-01 ENCOUNTER — Telehealth (INDEPENDENT_AMBULATORY_CARE_PROVIDER_SITE_OTHER): Payer: Managed Care, Other (non HMO) | Admitting: Family Medicine

## 2020-05-01 VITALS — Ht 66.25 in

## 2020-05-01 DIAGNOSIS — F419 Anxiety disorder, unspecified: Secondary | ICD-10-CM

## 2020-05-01 DIAGNOSIS — G4700 Insomnia, unspecified: Secondary | ICD-10-CM

## 2020-05-01 DIAGNOSIS — F902 Attention-deficit hyperactivity disorder, combined type: Secondary | ICD-10-CM | POA: Diagnosis not present

## 2020-05-01 MED ORDER — AMPHETAMINE-DEXTROAMPHETAMINE 20 MG PO TABS: 20.0000 mg | ORAL_TABLET | Freq: Two times a day (BID) | ORAL | 0 refills | Status: DC | PRN

## 2020-05-01 NOTE — Progress Notes (Signed)
Virtual Visit via Video Note   I connected with Shalimar on 05/01/20 by a video enabled telemedicine application and verified that I am speaking with the correct person using two identifiers. Location patient: home Location provider:work office Persons participating in the virtual visit: patient, provider  I discussed the limitations of evaluation and management by telemedicine and the availability of in person appointments. The patient expressed understanding and agreed to proceed.   HPI: Gina Sanders is a 24 yo female following on ADHD. She was seen on 04/03/20,when she has concerned about possible ADHD. ASRS suggestive of ADHD, Vyvanse 20 mg was started. Medication helps a lot, "way more productive."  She also feels like anxiety is better. Negative for depressed mood. On Fluoxetine 20 mg daily.  She has tolerated medication well, no side effects.  She is sleeping well now. She has not tried OTC meds.  She would like to change for immediate release medication. States that she is going to have classes a different times, she wants to be able to take med a different times depending of schedule.  Vyvanse co-pay is high.  Negative for headache,CP,palpitation,SOB,abdominal pain,N/V,tremors,and tics.  ROS: See pertinent positives and negatives per HPI.  History reviewed. No pertinent past medical history.  Past Surgical History:  Procedure Laterality Date  . TONSILLECTOMY     Family History  Problem Relation Age of Onset  . Mental retardation Father   . Hyperlipidemia Maternal Grandfather     Social History   Socioeconomic History  . Marital status: Single    Spouse name: Not on file  . Number of children: Not on file  . Years of education: Not on file  . Highest education level: Not on file  Occupational History  . Not on file  Tobacco Use  . Smoking status: Never Smoker  . Smokeless tobacco: Never Used  Substance and Sexual Activity  . Alcohol use: No  . Drug use: No   . Sexual activity: Not on file  Other Topics Concern  . 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 Difficulty of Paying Living Expenses:   Food Insecurity:   . Worried About Programme researcher, broadcasting/film/video in the Last Year:   . Barista in the Last Year:   Transportation Needs:   . Freight forwarder (Medical):   Marland Kitchen Lack of Transportation (Non-Medical):   Physical Activity:   . Days of Exercise per Week:   . Minutes of Exercise per Session:   Stress:   . Feeling of Stress :   Social Connections:   . Frequency of Communication with Friends and Family:   . Frequency of Social Gatherings with Friends and Family:   . Attends Religious Services:   . Active Member of Clubs or Organizations:   . Attends Banker Meetings:   Marland Kitchen Marital Status:   Intimate Partner Violence:   . Fear of Current or Ex-Partner:   . Emotionally Abused:   Marland Kitchen Physically Abused:   . Sexually Abused:     Current Outpatient Medications:  .  etonogestrel (NEXPLANON) 68 MG  IMPL implant, 1 each by Subdermal route once. Implanted 05/22/2016 by [facility], Disp: , Rfl:  .  FLUoxetine (PROZAC) 20 MG tablet, TAKE 1 TABLET BY MOUTH EVERY DAY, Disp: 90 tablet, Rfl: 0 .  valACYclovir (VALTREX) 500 MG tablet, Take 500 mg by mouth daily., Disp: , Rfl:  .  amphetamine-dextroamphetamine (ADDERALL) 20 MG tablet, Take 1 tablet (20 mg total) by mouth 2 (two) times daily as needed., Disp: 60 tablet, Rfl: 0  EXAM:  VITALS per patient if applicable: Ht 5' 6.25" (1.683 m)   BMI 25.31 kg/m   GENERAL: alert, oriented, appears well and in no acute distress  HEENT: atraumatic, conjunctiva clear, no obvious abnormalities on  inspection.  LUNGS: on inspection no signs of respiratory distress, breathing rate appears normal, no obvious gross SOB, gasping or wheezing  CV: no obvious cyanosis  MS: moves all visible extremities without noticeable abnormality  PSYCH/NEURO: pleasant and cooperative, no obvious depression or anxiety, speech and thought processing grossly intact  ASSESSMENT AND PLAN:  Discussed the following assessment and plan:  Attention deficit hyperactivity disorder (ADHD), combined type - Plan: amphetamine-dextroamphetamine (ADDERALL) 20 MG tablet Because of cost she would like to change to a different medication, she preferred immediate release. Start Vyvanse.  Adderall 20 mg twice daily as needed started today. We discussed some side effects.  Insomnia, unspecified type Improved. Continue good sleep hygiene.  Anxiety disorder, unspecified type Being able to finish projects on time, improving concentration and productivity have helped with anxiety. Continue Fluoxetine 20 mg daily.  I discussed the assessment and treatment plan with the patient. Lane was provided an opportunity to ask questions and all were answered. She agreed with the plan and demonstrated an understanding of the instructions.   Return in about 4 weeks (around 05/29/2020) for ADHD.   Harsha Yusko [place], MD

## 2020-05-03 ENCOUNTER — Encounter: Payer: Self-pay | Admitting: Family Medicine

## 2020-05-13 DIAGNOSIS — Z03818 Encounter for observation for suspected exposure to other biological agents ruled out: Secondary | ICD-10-CM | POA: Diagnosis not present

## 2020-05-13 DIAGNOSIS — Z20822 Contact with and (suspected) exposure to covid-19: Secondary | ICD-10-CM | POA: Diagnosis not present

## 2020-05-31 ENCOUNTER — Encounter: Payer: Self-pay | Admitting: Family Medicine

## 2020-05-31 DIAGNOSIS — F902 Attention-deficit hyperactivity disorder, combined type: Secondary | ICD-10-CM

## 2020-06-01 ENCOUNTER — Other Ambulatory Visit: Payer: Self-pay | Admitting: Family Medicine

## 2020-06-01 DIAGNOSIS — F902 Attention-deficit hyperactivity disorder, combined type: Secondary | ICD-10-CM

## 2020-06-01 NOTE — Telephone Encounter (Signed)
Pt call and need a refill on amphetamine-dextroamphetamine (ADDERALL) 20 MG tablet and stated she will keep the same place that is  [id number] [place], [place] - [address] Phone:  [phone]  Fax:  [phone]

## 2020-06-05 ENCOUNTER — Encounter: Payer: Self-pay | Admitting: Family Medicine

## 2020-06-06 ENCOUNTER — Encounter: Payer: Self-pay | Admitting: Family Medicine

## 2020-06-06 ENCOUNTER — Encounter: Payer: Managed Care, Other (non HMO) | Admitting: Family Medicine

## 2020-06-06 ENCOUNTER — Telehealth (INDEPENDENT_AMBULATORY_CARE_PROVIDER_SITE_OTHER): Payer: BC Managed Care – PPO | Admitting: Family Medicine

## 2020-06-06 VITALS — Ht 66.25 in

## 2020-06-06 DIAGNOSIS — F902 Attention-deficit hyperactivity disorder, combined type: Secondary | ICD-10-CM

## 2020-06-06 DIAGNOSIS — F411 Generalized anxiety disorder: Secondary | ICD-10-CM

## 2020-06-06 DIAGNOSIS — G4700 Insomnia, unspecified: Secondary | ICD-10-CM

## 2020-06-06 MED ORDER — FLUOXETINE HCL 20 MG PO TABS: 20.0000 mg | ORAL_TABLET | Freq: Every day | ORAL | 1 refills | Status: DC

## 2020-06-06 MED ORDER — AMPHETAMINE-DEXTROAMPHETAMINE 20 MG PO TABS: 20.0000 mg | ORAL_TABLET | Freq: Two times a day (BID) | ORAL | 0 refills | Status: DC | PRN

## 2020-06-06 NOTE — Progress Notes (Signed)
Virtual Visit via Video Note I connected with [facility] on 06/06/20 by a video enabled telemedicine application and verified that I am speaking with the correct person using two identifiers.  Location patient: home Location provider:work office Persons participating in the virtual visit: patient, provider  I discussed the limitations of evaluation and management by telemedicine and the availability of in person appointments. The patient expressed understanding and agreed to proceed.  Chief Complaint  Patient presents with   Follow-up   HPI: Gina Sanders is a 24 year old female following up today on ADHD. She was last seen on 05/01/2020, when Vyvanse was changed to Adderall immediate release 20 mg twice daily. Medication is causing some dry mouth but in general she has tolerated well. Negative for headache,changes in appetite,wt loss, CP,palpitations,tremor,or tics.  Anxiety:She feels like it has been better since she started ADHD medication.  The fact that she can be "on top of things" has helped with anxiety. She is currently on fluoxetine 20 mg daily. She is not longer doing online counseling.  Negative for depressed mood.  Insomnia: She is also not sleeping much better. Sleeps about 7 to 8 hours. She feels rested next day.  ROS: See pertinent positives and negatives per HPI.  History reviewed. No pertinent past medical history.  Past Surgical History:  Procedure Laterality Date   TONSILLECTOMY      Family History  Problem Relation Age of Onset   Mental retardation Father    Hyperlipidemia Maternal Grandfather     Social History   Socioeconomic History   Marital status: Single    Spouse name: Not on file   Number of children: Not on file   Years of education: Not on file   Highest education level: Not on file  Occupational History   Not on file  Tobacco Use   Smoking status: Never Smoker   Smokeless tobacco: Never Used  Substance and Sexual Activity   Alcohol  use: No   Drug use: No   Sexual activity: Not on file  Other Topics Concern   Not on file  Social History Narrative   Work or School: Systems analyst, 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      Home Situation: lives with mom, dad and sister      Spiritual Beliefs: Christian      Lifestyle: dances daily; healthy diet            Social Determinants of Health   Financial Resource Strain:    Difficulty of Paying Living Expenses: Not on file  Food Insecurity:    Worried About Programme researcher, broadcasting/film/video in the Last Year: Not on file   [employer] of Food in the Last Year: Not on file  Transportation Needs:    Lack of Transportation (Medical): Not on file   Lack of Transportation (Non-Medical): Not on file  Physical Activity:    Days of Exercise per Week: Not on file   Minutes of Exercise per Session: Not on file  Stress:    Feeling of Stress : Not on file  Social Connections:    Frequency of Communication with Friends and Family: Not on file   Frequency of Social Gatherings with Friends and Family: Not on file   Attends Religious Services: Not on file   Active Member of Clubs or Organizations: Not on file   Attends Banker Meetings: Not on file   Marital Status: Not on file  Intimate Partner Violence:  Fear of Current or Ex-Partner: Not on file   Emotionally Abused: Not on file   Physically Abused: Not on file   Sexually Abused: Not on file    Current Outpatient Medications:    amphetamine-dextroamphetamine (ADDERALL) 20 MG tablet, Take 1 tablet (20 mg total) by mouth 2 (two) times daily as needed., Disp: 60 tablet, Rfl: 0   etonogestrel (NEXPLANON) 68 MG IMPL implant, 1 each by Subdermal route once. Implanted 05/22/2016 by [facility], Disp: , Rfl:    FLUoxetine (PROZAC) 20 MG tablet, Take 1 tablet (20 mg total) by mouth daily., Disp: 90 tablet, Rfl: 1   valACYclovir (VALTREX) 500 MG tablet, Take 500 mg by mouth daily., Disp:  , Rfl:   EXAM:  VITALS per patient if applicable:Ht 5' 6.25" (1.683 m)    BMI 25.31 kg/m   GENERAL: alert, oriented, appears well and in no acute distress  HEENT: atraumatic, conjunctiva clear, no obvious abnormalities on inspection.  NECK: normal movements of the head and neck  LUNGS: on inspection no signs of respiratory distress, breathing rate appears normal, no obvious gross SOB, gasping or wheezing  CV: no obvious cyanosis  MS: moves all visible extremities without noticeable abnormality  PSYCH/NEURO: pleasant and cooperative, no obvious depression or anxiety, speech and thought processing grossly intact  ASSESSMENT AND PLAN:  Discussed the following assessment and plan:  Insomnia, unspecified type Improved. Continue good sleep hygiene.  Attention deficit hyperactivity disorder (ADHD), combined type  Reporting problem as well controlled with Adderall. No changes in current management. Med contract next OV. [place] controlled substance report reviewed. Rx x 3 sent.  - Plan: amphetamine-dextroamphetamine (ADDERALL) 20 MG tablet, DISCONTINUED: amphetamine-dextroamphetamine (ADDERALL) 20 MG tablet, DISCONTINUED: amphetamine-dextroamphetamine (ADDERALL) 20 MG tablet  GAD (generalized anxiety disorder) - Plan: FLUoxetine (PROZAC) 20 MG tablet Improved. Continue Fluoxetine 40 mg daily. We could consider decreasing dose in a few months.   Prescriptions number please send to her pharmacy.   I discussed the assessment and treatment plan with the patient. Gina Sanders was provided an opportunity to ask questions and all were answered. She agreed with the plan and demonstrated an understanding of the instructions.   Return in about 4 months (around 10/06/2020) for ADHD.    Gina Mcmurphy [place], MD

## 2020-06-09 ENCOUNTER — Encounter: Payer: Self-pay | Admitting: Family Medicine

## 2020-06-12 ENCOUNTER — Other Ambulatory Visit: Payer: Self-pay

## 2020-06-12 MED ORDER — FLUOXETINE HCL 20 MG PO CAPS: 20.0000 mg | ORAL_CAPSULE | Freq: Every day | ORAL | 2 refills | Status: DC

## 2020-06-20 DIAGNOSIS — Z23 Encounter for immunization: Secondary | ICD-10-CM | POA: Diagnosis not present

## 2020-07-13 DIAGNOSIS — Z23 Encounter for immunization: Secondary | ICD-10-CM | POA: Diagnosis not present

## 2020-08-24 ENCOUNTER — Ambulatory Visit: Payer: BC Managed Care – PPO | Admitting: Family Medicine

## 2020-08-28 ENCOUNTER — Encounter: Payer: Self-pay | Admitting: Family Medicine

## 2020-08-28 ENCOUNTER — Telehealth: Payer: BC Managed Care – PPO | Admitting: Family Medicine

## 2020-08-28 VITALS — Ht 66.25 in

## 2020-08-28 DIAGNOSIS — L219 Seborrheic dermatitis, unspecified: Secondary | ICD-10-CM | POA: Diagnosis not present

## 2020-08-28 DIAGNOSIS — F902 Attention-deficit hyperactivity disorder, combined type: Secondary | ICD-10-CM | POA: Diagnosis not present

## 2020-08-28 DIAGNOSIS — R209 Unspecified disturbances of skin sensation: Secondary | ICD-10-CM | POA: Diagnosis not present

## 2020-08-28 DIAGNOSIS — L853 Xerosis cutis: Secondary | ICD-10-CM

## 2020-08-28 MED ORDER — AMPHETAMINE-DEXTROAMPHETAMINE 20 MG PO TABS: 20.0000 mg | ORAL_TABLET | Freq: Two times a day (BID) | ORAL | 0 refills | Status: DC | PRN

## 2020-08-28 MED ORDER — KETOCONAZOLE 2 % EX SHAM: 1.0000 "application " | MEDICATED_SHAMPOO | CUTANEOUS | 6 refills | Status: DC

## 2020-08-28 NOTE — Progress Notes (Signed)
Virtual Visit via Video Note I connected with Gina Sanders on 08/28/20 by a video enabled telemedicine application and verified that I am speaking with the correct person using two identifiers.  Location patient: home Location provider:work office Persons participating in the virtual visit: patient, provider  I discussed the limitations of evaluation and management by telemedicine and the availability of in person appointments. The patient expressed understanding and agreed to proceed.  HPI: Gina Sanders is a 24 yo female following on ADHD and anxiety. She is on Adderall 20 mg bid.. She feels like medication is definitively helping with efficiency, less distracted and able to take better notes in class. She gets "little" anxious when she feels medication weaning off. She has dealt  with this well, she changes activity,stars cooking ,or watch TV.  Anxiety: She is on Prozac 20 mg, also helping. She is having a lot of stress, school related. Negative for depressed mood.  -Hand dry skin. She has not noted rash or pruritus.  "Flaky" and pruritic scalp for years. Dx'ed with scalp psoriasis years ago. She used to follow with dermatologist. She has used OTC shampoos have not helped. Negative for erythematous rash or scalp lesions. No associated hear loss.  Cold nose, ears, fingertips usually around this time of the year when weather is cold.Marland Kitchen  Her mother has history of Raynaud's syndrome, she wonders if she has the same problem. She has not noted color or skin changes. No associated arthralgias,joint edema/erythema,pale finger tips,or pain.  ROS: See pertinent positives and negatives per HPI.  History reviewed. No pertinent past medical history.  Past Surgical History:  Procedure Laterality Date  . TONSILLECTOMY      Family History  Problem Relation Age of Onset  . Mental retardation Father   . Hyperlipidemia Maternal Grandfather     Social History   Socioeconomic History  . Marital  status: Single    Spouse name: Not on file  . Number of children: Not on file  . Years of education: Not on file  . Highest education level: Not on file  Occupational History  . Not on file  Tobacco Use  . Smoking status: Never Smoker  . Smokeless tobacco: Never Used  Substance and Sexual Activity  . Alcohol use: No  . Drug use: No  . Sexual activity: Not on file  Other Topics Concern  . 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 Difficulty of Paying Living Expenses: Not on file  Food Insecurity:   . Worried About Programme researcher, broadcasting/film/video in the Last Year: Not on file  . Ran Out of Food in the Last Year: Not on file  Transportation Needs:   . Lack of Transportation (Medical): Not on file  . Lack of Transportation (Non-Medical): Not on file  Physical Activity:   . Days of Exercise per Week: Not on file  . Minutes of Exercise per Session: Not on file  Stress:   . Feeling of Stress : Not on file  Social Connections:   . Frequency of Communication with Friends and Family: Not on file  . Frequency  of Social Gatherings with Friends and Family: Not on file  . Attends Religious Services: Not on file  . Active Member of Clubs or Organizations: Not on file  . Attends Banker Meetings: Not on file  . Marital Status: Not on file  Intimate Partner Violence:   . Fear of Current or Ex-Partner: Not on file  . Emotionally Abused: Not on file  . Physically Abused: Not on file  . Sexually Abused: Not on file      Current Outpatient Medications:  .  amphetamine-dextroamphetamine (ADDERALL) 20 MG tablet, Take 1 tablet (20 mg total) by mouth 2 (two) times daily as needed., Disp: 60 tablet,  Rfl: 0 .  etonogestrel (NEXPLANON) 68 MG IMPL implant, 1 each by Subdermal route once. Implanted 05/22/2016 by [facility], Disp: , Rfl:  .  FLUoxetine (PROZAC) 20 MG capsule, Take 1 capsule (20 mg total) by mouth daily., Disp: 90 capsule, Rfl: 2 .  valACYclovir (VALTREX) 500 MG tablet, Take 500 mg by mouth daily., Disp: , Rfl:  .  ketoconazole (NIZORAL) 2 % shampoo, Apply 1 application topically 2 (two) times a week., Disp: 120 mL, Rfl: 6  EXAM:  VITALS per patient if applicable:Ht 5' 6.25" (1.683 m)   BMI 25.31 kg/m   GENERAL: alert, oriented, appears well and in no acute distress  HEENT: atraumatic, conjunctiva clear, no obvious abnormalities on inspection.  NECK: normal movements of the head and neck  LUNGS: on inspection no signs of respiratory distress, breathing rate appears normal, no obvious gross SOB, gasping or wheezing  CV: no obvious cyanosis  MS: moves all visible extremities without noticeable abnormality  SKIN: Dryness on hands, MCP joints mainly.  PSYCH/NEURO: pleasant and cooperative, no obvious depression or anxiety, speech and thought processing grossly intact  ASSESSMENT AND PLAN:  Discussed the following assessment and plan:  Attention deficit hyperactivity disorder (ADHD), combined type - Plan: amphetamine-dextroamphetamine (ADDERALL) 20 MG tablet, DISCONTINUED: amphetamine-dextroamphetamine (ADDERALL) 20 MG tablet, DISCONTINUED: amphetamine-dextroamphetamine (ADDERALL) 20 MG tablet  Problem is adequately controlled. Continue Adderall 20 mg bid.Rx X 3 sent.  We discussed some side effects. Non pharmacologic strategies reviewed. She is planning on establishing with psychologist, hoping to learn new strategies to cope with ADHD,procrastination.   Seborrheic dermatitis of scalp - Plan: ketoconazole (NIZORAL) 2 % shampoo She agrees with trying Ketoconazole shampoo. If problem is not any better, derma evaluation can be considered.  Dry skin  dermatitis Topical vaseline on wet skin after washing hands or shower may help. Cetaphil moisturizer as needed may also help.  Cold finger without peripheral vascular disease We discussed clinical features of Raynaud synd,prognosis,and treatment options. Hx is not suggestive a serious process or rheumatologic disorder. Appropriate skin care.  I discussed the assessment and treatment plan with the patient. Shemica was provided an opportunity to ask questions and all were answered. She agreed with the plan and demonstrated an understanding of the instructions.   Return in about 5 months (around 01/26/2021) for ADHD medication.  Yuvraj Pfeifer [place], MD

## 2020-08-28 NOTE — Progress Notes (Signed)
Patient scheduled 01/25/2021 at 2 PM

## 2020-09-01 ENCOUNTER — Encounter: Payer: Self-pay | Admitting: Family Medicine

## 2020-10-30 DIAGNOSIS — F4320 Adjustment disorder, unspecified: Secondary | ICD-10-CM | POA: Diagnosis not present

## 2020-11-02 ENCOUNTER — Ambulatory Visit (INDEPENDENT_AMBULATORY_CARE_PROVIDER_SITE_OTHER): Payer: BC Managed Care – PPO

## 2020-11-02 ENCOUNTER — Ambulatory Visit: Payer: BC Managed Care – PPO | Admitting: Family Medicine

## 2020-11-02 ENCOUNTER — Other Ambulatory Visit: Payer: Self-pay

## 2020-11-02 ENCOUNTER — Encounter: Payer: Self-pay | Admitting: Family Medicine

## 2020-11-02 VITALS — BP 118/70 | HR 74 | Resp 16 | Ht 66.25 in | Wt 151.0 lb

## 2020-11-02 DIAGNOSIS — N6019 Diffuse cystic mastopathy of unspecified breast: Secondary | ICD-10-CM

## 2020-11-02 DIAGNOSIS — Z0184 Encounter for antibody response examination: Secondary | ICD-10-CM

## 2020-11-02 DIAGNOSIS — R079 Chest pain, unspecified: Secondary | ICD-10-CM | POA: Diagnosis not present

## 2020-11-02 DIAGNOSIS — F411 Generalized anxiety disorder: Secondary | ICD-10-CM

## 2020-11-02 DIAGNOSIS — F902 Attention-deficit hyperactivity disorder, combined type: Secondary | ICD-10-CM

## 2020-11-02 MED ORDER — NAPROXEN 500 MG PO TABS: 500.0000 mg | ORAL_TABLET | Freq: Two times a day (BID) | ORAL | 0 refills | Status: AC

## 2020-11-02 NOTE — Progress Notes (Signed)
Chief Complaint  Patient presents with  . Follow-up  . breast tenderness   HPI: [name] is a 25 y.o. female, who is here today with some concerns. Requesting titers of hep B for school.  -1-2 months of CP, left side, above breast. Pain is intermittent, dull or sharp. Lasts < 5 min. No associated palpitations,SOB, or diaphoresis. No known injury.  She has not noted edema,erythema,or skin changes around affected area. Negative for heartburn,N/V,or abdominal pain.  Sometimes exacerbated by lying on left side or with lifting. Stable. She has not taken OTC medications for pain.  Certain exercises seem to help with alleviating pain.  Bilateral breast tenderness around menstrual cycles. She has not noted masses or nipple discharge.  She thinks it is also exacerbated by taking her ADHD medication. Decreasing Adderall dose helped some. She wonders if she can decrease dose. She does not want to stop it because it has really help with time management with school projects. She is also seeing a psychologies and trying to implement strategies that help her with procrastination and concentration. She has difficulty staying still/seated for long classes.  She also feels like her anxiety is better controlled since she started Adderall but feels more anxious when she feels like Adderall effect is fading. She is on Fluoxetine 20 mg daily, which has also helped.  Started taking vits and feels better, also helping with dry skin.  No tobacco use but she vapes nicotine.  Review of Systems  Constitutional: Negative for activity change, appetite change, fever and unexpected weight change.  HENT: Negative for mouth sores, nosebleeds, sore throat and trouble swallowing.   Respiratory: Negative for cough and wheezing.   Gastrointestinal: Negative for abdominal pain, nausea and vomiting.       Negative for changes in bowel habits.  Genitourinary: Negative for decreased urine volume  and hematuria.  Skin: Negative for pallor and rash.  Neurological: Negative for syncope, weakness and headaches.  Psychiatric/Behavioral: Negative for confusion. The patient is nervous/anxious.   Rest see pertinent positives and negatives per HPI.  Current Outpatient Medications on File Prior to Visit  Medication Sig Dispense Refill  . amphetamine-dextroamphetamine (ADDERALL) 20 MG tablet Take 1 tablet (20 mg total) by mouth 2 (two) times daily as needed. 60 tablet 0  . etonogestrel (NEXPLANON) 68 MG IMPL implant 1 each by Subdermal route once. Implanted 05/22/2016 by [facility]    . FLUoxetine (PROZAC) 20 MG capsule Take 1 capsule (20 mg total) by mouth daily. 90 capsule 2  . ketoconazole (NIZORAL) 2 % shampoo Apply 1 application topically 2 (two) times a week. 120 mL 6  . valACYclovir (VALTREX) 500 MG tablet Take 500 mg by mouth daily.     No current facility-administered medications on file prior to visit.   History reviewed. No pertinent past medical history. Allergies  Allergen Reactions  . Sulfa Antibiotics    Social History   Socioeconomic History  . Marital status: Single    Spouse name: Not on file  . Number of children: Not on file  . Years of education: Not on file  . Highest education level: Not on file  Occupational History  . Not on file  Tobacco Use  . Smoking status: Never Smoker  . Smokeless tobacco: Never Used  Substance and Sexual Activity  . Alcohol use: No  . Drug use: No  . Sexual activity: Not on file  Other Topics Concern  . 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  Physical Exam Vitals and nursing note reviewed.  Constitutional:      General: She is not in acute distress.    Appearance: She is well-developed.  HENT:     Head: Normocephalic and atraumatic.     Mouth/Throat:     Mouth: Oropharynx is clear and moist and mucous membranes are normal.  Eyes:     Conjunctiva/sclera: Conjunctivae normal.     Pupils: Pupils are equal, round, and reactive to light.  Cardiovascular:     Rate and Rhythm: Normal rate and regular rhythm.     Pulses:          Dorsalis pedis pulses are 2+ on the right side and 2+ on the left side.     Heart sounds: No murmur heard.   Pulmonary:     Effort: Pulmonary effort is normal. No respiratory distress.     Breath sounds: Normal breath sounds.  Chest:  Breasts:     Right: No axillary adenopathy or supraclavicular adenopathy.     Left: No axillary adenopathy or supraclavicular adenopathy.     Abdominal:     Palpations: Abdomen is soft. There is no hepatomegaly or mass.     Tenderness: There is no abdominal tenderness.  Genitourinary:    Comments: Fibrocystic like changes, mild tenderness, bilateral. Musculoskeletal:        General: No edema.  Lymphadenopathy:     Cervical: No cervical adenopathy.     Upper Body:     Right upper body: No supraclavicular or axillary adenopathy.     Left upper body: No supraclavicular or axillary adenopathy.  Skin:    General: Skin is warm.     Findings: No erythema or rash.  Neurological:     Mental Status: She is alert and oriented to person, place, and time.     Cranial Nerves: No cranial nerve deficit.     Gait: Gait normal.     Deep Tendon Reflexes: Strength normal.  Psychiatric:        Mood and Affect: Mood and affect normal.     Comments: Well groomed, good eye contact.    ASSESSMENT AND PLAN:  [name] was seen today for follow-up and breast tenderness.  Diagnoses and all orders for this visit: Orders Placed This Encounter  Procedures  . DG Chest 2 View  . TSH  . Basic metabolic panel  . CBC   Lab Results  Component Value Date   WBC 6.4 11/02/2020   HGB 13.6 11/02/2020   HCT 39.1 11/02/2020   MCV 96.1 11/02/2020   PLT 266 11/02/2020   Lab Results  Component Value Date   CREATININE 0.71 11/02/2020   BUN 12 11/02/2020   NA 138 11/02/2020   K 4.3 11/02/2020  CL 104 11/02/2020   CO2 26 11/02/2020   Lab Results  Component Value Date   TSH 2.71 11/02/2020   Attention deficit hyperactivity disorder (ADHD), combined type Problem is otherwise well controlled. She does not want to try long ER med. She would like to decrease dose of Adderall from 20 mg to 10 mg bid. Continue CBT.  Chest pain, unspecified type We discussed possible etiologies. Hx and examination do not suggest a serious process. Most likely musculoskeletal.She agrees with 7-10 days of NSAID's, take med with food. CXR ordered today. Further recommendations according to imaging/lab results.  -     naproxen (NAPROSYN) 500 MG tablet; Take 1 tablet (500 mg total) by mouth 2 (two) times daily with a meal for 10 days.   Immunity status testing -     Hepatitis B surface antibody,quantitative  Fibrocystic breast disease (FCBD), unspecified laterality We discussed dx,prognosis,and treatment options. Caffeine and nicotine may aggravate problem. Could be contributing to left-sided CP.  GAD (generalized anxiety disorder) Stable overall. Continue CBT. Continue Fluoxetine 20 mg daily.  Spent 44 minutes.  During this time history was obtained and documented, examination was performed, and assessment/plan discussed.  Return in about 8 weeks (around 12/28/2020).   Cortlyn Cannell G. [place], MD  [facility]. [place] office.  A few things to remember from today's  visit:  Attention deficit hyperactivity disorder (ADHD), combined type  GAD (generalized anxiety disorder)  Chest pain, unspecified type - Plan: DG Chest 2 View, naproxen (NAPROSYN) 500 MG tablet  If you need refills please call your pharmacy. Do not use My Chart to request refills or for acute issues that need immediate attention.   Decrease adderall to 10 mg 2 times daily.  No changes in Prozac. Topical icy hot or asper cream with lidocaine.  Please be sure medication list is accurate. If a new problem present, please set up appointment sooner than planned today.

## 2020-11-02 NOTE — Patient Instructions (Addendum)
A few things to remember from today's visit:  Attention deficit hyperactivity disorder (ADHD), combined type  GAD (generalized anxiety disorder)  Chest pain, unspecified type - Plan: DG Chest 2 View, naproxen (NAPROSYN) 500 MG tablet  If you need refills please call your pharmacy. Do not use My Chart to request refills or for acute issues that need immediate attention.   Decrease adderall to 10 mg 2 times daily.  No changes in Prozac. Topical icy hot or asper cream with lidocaine.  Please be sure medication list is accurate. If a new problem present, please set up appointment sooner than planned today.

## 2020-11-04 ENCOUNTER — Encounter: Payer: Self-pay | Admitting: Family Medicine

## 2020-11-05 LAB — BASIC METABOLIC PANEL
BUN: 12 mg/dL (ref 7–25)
CO2: 26 mmol/L (ref 20–32)
Calcium: 9.2 mg/dL (ref 8.6–10.2)
Chloride: 104 mmol/L (ref 98–110)
Creat: 0.71 mg/dL (ref 0.50–1.10)
Glucose, Bld: 87 mg/dL (ref 65–99)
Potassium: 4.3 mmol/L (ref 3.5–5.3)
Sodium: 138 mmol/L (ref 135–146)

## 2020-11-05 LAB — CBC
HCT: 39.1 % (ref 35.0–45.0)
Hemoglobin: 13.6 g/dL (ref 11.7–15.5)
MCH: 33.4 pg — ABNORMAL HIGH (ref 27.0–33.0)
MCHC: 34.8 g/dL (ref 32.0–36.0)
MCV: 96.1 fL (ref 80.0–100.0)
MPV: 10.3 fL (ref 7.5–12.5)
Platelets: 266 10*3/uL (ref 140–400)
RBC: 4.07 10*6/uL (ref 3.80–5.10)
RDW: 11.4 % (ref 11.0–15.0)
WBC: 6.4 10*3/uL (ref 3.8–10.8)

## 2020-11-05 LAB — HEPATITIS B SURFACE ANTIBODY, QUANTITATIVE: Hep B S AB Quant (Post): >1000 m[IU]/mL (ref >=10)

## 2020-11-05 LAB — TSH: TSH: 2.71 mIU/L

## 2020-11-06 DIAGNOSIS — F4320 Adjustment disorder, unspecified: Secondary | ICD-10-CM | POA: Diagnosis not present

## 2020-11-06 MED ORDER — AMPHETAMINE-DEXTROAMPHETAMINE 10 MG PO TABS: 10.0000 mg | ORAL_TABLET | Freq: Two times a day (BID) | ORAL | 0 refills | Status: DC

## 2020-11-06 MED ORDER — VALACYCLOVIR HCL 500 MG PO TABS: 500.0000 mg | ORAL_TABLET | Freq: Every day | ORAL | 2 refills | Status: DC

## 2020-11-12 DIAGNOSIS — F4320 Adjustment disorder, unspecified: Secondary | ICD-10-CM | POA: Diagnosis not present

## 2020-11-27 DIAGNOSIS — F4320 Adjustment disorder, unspecified: Secondary | ICD-10-CM | POA: Diagnosis not present

## 2020-12-04 ENCOUNTER — Other Ambulatory Visit: Payer: Self-pay | Admitting: Family Medicine

## 2020-12-04 DIAGNOSIS — F4320 Adjustment disorder, unspecified: Secondary | ICD-10-CM | POA: Diagnosis not present

## 2020-12-04 NOTE — Telephone Encounter (Signed)
Last fill 11/06/20 Last office visit   Attention deficit hyperactivity disorder (ADHD), combined type Problem is otherwise well controlled. She does not want to try long ER med. She would like to decrease dose of Adderall from 20 mg to 10 mg bid. Continue CBT.

## 2020-12-04 NOTE — Telephone Encounter (Signed)
Pt need a refill on amphetamine-dextroamphetamine (ADDERALL) 10 MG tablet  [id number] - [place], [place] - [address], [address]  Phone:  [phone] Fax:  [phone]

## 2020-12-05 MED ORDER — AMPHETAMINE-DEXTROAMPHETAMINE 10 MG PO TABS: 10.0000 mg | ORAL_TABLET | Freq: Two times a day (BID) | ORAL | 0 refills | Status: DC

## 2020-12-11 DIAGNOSIS — F4320 Adjustment disorder, unspecified: Secondary | ICD-10-CM | POA: Diagnosis not present

## 2020-12-12 ENCOUNTER — Encounter: Payer: Self-pay | Admitting: Family Medicine

## 2020-12-14 ENCOUNTER — Other Ambulatory Visit: Payer: Self-pay | Admitting: Family Medicine

## 2020-12-14 DIAGNOSIS — F902 Attention-deficit hyperactivity disorder, combined type: Secondary | ICD-10-CM

## 2020-12-14 MED ORDER — AMPHETAMINE-DEXTROAMPHETAMINE 20 MG PO TABS: 20.0000 mg | ORAL_TABLET | Freq: Two times a day (BID) | ORAL | 0 refills | Status: DC

## 2020-12-27 DIAGNOSIS — F4320 Adjustment disorder, unspecified: Secondary | ICD-10-CM | POA: Diagnosis not present

## 2021-01-01 DIAGNOSIS — F4320 Adjustment disorder, unspecified: Secondary | ICD-10-CM | POA: Diagnosis not present

## 2021-01-14 ENCOUNTER — Encounter: Payer: Self-pay | Admitting: Family Medicine

## 2021-01-14 DIAGNOSIS — F902 Attention-deficit hyperactivity disorder, combined type: Secondary | ICD-10-CM

## 2021-01-15 DIAGNOSIS — F439 Reaction to severe stress, unspecified: Secondary | ICD-10-CM | POA: Diagnosis not present

## 2021-01-15 MED ORDER — AMPHETAMINE-DEXTROAMPHETAMINE 20 MG PO TABS: 20.0000 mg | ORAL_TABLET | Freq: Two times a day (BID) | ORAL | 0 refills | Status: DC

## 2021-01-22 DIAGNOSIS — F439 Reaction to severe stress, unspecified: Secondary | ICD-10-CM | POA: Diagnosis not present

## 2021-01-25 ENCOUNTER — Ambulatory Visit: Payer: BC Managed Care – PPO | Admitting: Family Medicine

## 2021-02-05 ENCOUNTER — Ambulatory Visit: Payer: BC Managed Care – PPO | Admitting: Family Medicine

## 2021-02-06 ENCOUNTER — Encounter: Payer: Self-pay | Admitting: Family Medicine

## 2021-02-06 ENCOUNTER — Telehealth (INDEPENDENT_AMBULATORY_CARE_PROVIDER_SITE_OTHER): Payer: BC Managed Care – PPO | Admitting: Family Medicine

## 2021-02-06 VITALS — Ht 66.25 in

## 2021-02-06 DIAGNOSIS — F411 Generalized anxiety disorder: Secondary | ICD-10-CM | POA: Diagnosis not present

## 2021-02-06 DIAGNOSIS — F902 Attention-deficit hyperactivity disorder, combined type: Secondary | ICD-10-CM

## 2021-02-06 MED ORDER — AMPHETAMINE-DEXTROAMPHETAMINE 20 MG PO TABS: 20.0000 mg | ORAL_TABLET | Freq: Two times a day (BID) | ORAL | 0 refills | Status: DC

## 2021-02-06 NOTE — Progress Notes (Signed)
Patient is scheduled 09/12 at 12 PM

## 2021-02-06 NOTE — Progress Notes (Signed)
Virtual Visit via Video Note I connected with Gina Sanders on 02/06/21 by a video enabled telemedicine application and verified that I am speaking with the correct person using two identifiers.  Location patient: home Location provider:work office Persons participating in the virtual visit: patient, provider  I discussed the limitations of evaluation and management by telemedicine and the availability of in person appointments. The patient expressed understanding and agreed to proceed.   HPI: Gina Sanders is a 25 yo female following on ADHD and anxiety. She was last seen on 11/02/20, virtual visit. ADHD: She is on Adderall 20 mg bid, she tried to decrease dose of Adderall to 10 mg bid but did not help. Medication is still helping, she is able to complete assignments without being easily distracted. Procrastination is still a problem. She attended web seminar about ADHD, trying to improve time management. She is still doing CBT weekly. She wonders if she should try ER medication. She has tried Vyvanse, did not like it.  Negative for changes in appetite,wt loss,palpitations,tremor,or tics. + Dry mouth.  Medication is not affecting her sleep.  She is looking into school accommodations to be allowed to have extra time for test completion. She had her last final yesterday, getting ready for Summer break. Resuming school in 05/2021, which will be more intense.  Anxiety: She is on Fluoxetine 20 mg daily, which is still helping with anxiety. Problem is exacerbated by stress at school. Having ADHD better controlled has helped.  Negative for depressed mood.  ROS: See pertinent positives and negatives per HPI.  History reviewed. No pertinent past medical history.  Past Surgical History:  Procedure Laterality Date  . TONSILLECTOMY      Family History  Problem Relation Age of Onset  . Mental retardation Father   . Hyperlipidemia Maternal Grandfather     Social History   Socioeconomic History  .  Marital status: Single    Spouse name: Not on file  . Number of children: Not on file  . Years of education: Not on file  . Highest education level: Not on file  Occupational History  . Not on file  Tobacco Use  . Smoking status: Never Smoker  . Smokeless tobacco: Never Used  Substance and Sexual Activity  . Alcohol use: No  . Drug use: No  . Sexual activity: Not on file  Other Topics Concern  . 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  etonogestrel (NEXPLANON) 68 MG IMPL implant, 1 each by Subdermal route once. Implanted 05/22/2016 by [facility], Disp: , Rfl:  .  FLUoxetine (PROZAC) 20 MG capsule, Take 1 capsule (20 mg total) by mouth daily., Disp: 90 capsule, Rfl: 2 .  ketoconazole (NIZORAL) 2 % shampoo, Apply 1 application topically 2 (two) times a week., Disp: 120 mL, Rfl: 6 .  valACYclovir (VALTREX) 500 MG  tablet, Take 1 tablet (500 mg total) by mouth daily., Disp: 90 tablet, Rfl: 2 .  amphetamine-dextroamphetamine (ADDERALL) 20 MG tablet, Take 1 tablet (20 mg total) by mouth 2 (two) times daily., Disp: 60 tablet, Rfl: 0  EXAM:  VITALS per patient if applicable:Ht 5' 6.25" (1.683 m)   BMI 24.19 kg/m   GENERAL: alert, oriented, appears well and in no acute distress  HEENT: atraumatic, conjunctiva clear, no obvious abnormalities on inspection.  NECK: normal movements of the head and neck  LUNGS: on inspection no signs of respiratory  distress, breathing rate appears normal, no obvious gross SOB, gasping or wheezing  CV: no obvious cyanosis  MS: moves all visible extremities without noticeable abnormality  PSYCH/NEURO: pleasant and cooperative, no obvious depression. + Anxious. Speech and thought processing grossly intact  ASSESSMENT AND PLAN:  Discussed the following assessment and plan:  Attention deficit hyperactivity disorder (ADHD), combined type - Plan: amphetamine-dextroamphetamine (ADDERALL) 20 MG tablet We reviewed Dx,prognosis,and different treatment options. We has changed Adderall from 20 mg to 10 mg and back to 20 mg. We discussed guidelines in regard to chronic controlled med use.  controlled subs report reviewed. We have not signed a medications contract, next visit needs to be in person. For now she does not want to try ER Adderall and would like to have 3 Rx send to her pharmacy.  We could also consider getting establish with psychiatrist or with provider at [facility]. Continue CBT.  GAD (generalized anxiety disorder) Stable. Continue Fluoxetine 20 mg daily and CBT.  I discussed the assessment and treatment plan with the patient. Gina Sanders was provided an opportunity to ask questions and all were answered. She agreed with the plan and demonstrated an understanding of the instructions.  Spent 30 minutes.During this time history was obtained and documented, and assessment/plan discussed.  Return in about 4 months (around 06/09/2021) for ADHD and anxiety.  Thelton Graca [place], MD

## 2021-02-12 DIAGNOSIS — F439 Reaction to severe stress, unspecified: Secondary | ICD-10-CM | POA: Diagnosis not present

## 2021-02-20 DIAGNOSIS — F439 Reaction to severe stress, unspecified: Secondary | ICD-10-CM | POA: Diagnosis not present

## 2021-03-05 DIAGNOSIS — F439 Reaction to severe stress, unspecified: Secondary | ICD-10-CM | POA: Diagnosis not present

## 2021-03-14 DIAGNOSIS — F439 Reaction to severe stress, unspecified: Secondary | ICD-10-CM | POA: Diagnosis not present

## 2021-03-18 ENCOUNTER — Other Ambulatory Visit: Payer: Self-pay | Admitting: Family Medicine

## 2021-03-18 DIAGNOSIS — F902 Attention-deficit hyperactivity disorder, combined type: Secondary | ICD-10-CM

## 2021-03-20 ENCOUNTER — Encounter: Payer: Self-pay | Admitting: Family Medicine

## 2021-03-20 DIAGNOSIS — F439 Reaction to severe stress, unspecified: Secondary | ICD-10-CM | POA: Diagnosis not present

## 2021-03-22 ENCOUNTER — Other Ambulatory Visit: Payer: Self-pay | Admitting: Family Medicine

## 2021-03-22 DIAGNOSIS — F902 Attention-deficit hyperactivity disorder, combined type: Secondary | ICD-10-CM

## 2021-03-22 MED ORDER — AMPHETAMINE-DEXTROAMPHETAMINE 20 MG PO TABS: 20.0000 mg | ORAL_TABLET | Freq: Two times a day (BID) | ORAL | 0 refills | Status: DC

## 2021-03-26 DIAGNOSIS — F439 Reaction to severe stress, unspecified: Secondary | ICD-10-CM | POA: Diagnosis not present

## 2021-03-27 ENCOUNTER — Other Ambulatory Visit: Payer: Self-pay | Admitting: Family Medicine

## 2021-04-09 DIAGNOSIS — F439 Reaction to severe stress, unspecified: Secondary | ICD-10-CM | POA: Diagnosis not present

## 2021-04-23 DIAGNOSIS — F439 Reaction to severe stress, unspecified: Secondary | ICD-10-CM | POA: Diagnosis not present

## 2021-05-01 DIAGNOSIS — F439 Reaction to severe stress, unspecified: Secondary | ICD-10-CM | POA: Diagnosis not present

## 2021-05-08 DIAGNOSIS — F439 Reaction to severe stress, unspecified: Secondary | ICD-10-CM | POA: Diagnosis not present

## 2021-05-15 DIAGNOSIS — F439 Reaction to severe stress, unspecified: Secondary | ICD-10-CM | POA: Diagnosis not present

## 2021-05-20 ENCOUNTER — Encounter: Payer: Self-pay | Admitting: Family Medicine

## 2021-05-20 DIAGNOSIS — F902 Attention-deficit hyperactivity disorder, combined type: Secondary | ICD-10-CM

## 2021-05-22 ENCOUNTER — Telehealth: Payer: Self-pay

## 2021-05-22 NOTE — Telephone Encounter (Signed)
Patient called to check the status of her Rx refill patient states she is completely out and would like a call back if or when Rx is sent to the pharmacy.

## 2021-05-23 ENCOUNTER — Other Ambulatory Visit: Payer: Self-pay

## 2021-05-23 DIAGNOSIS — F902 Attention-deficit hyperactivity disorder, combined type: Secondary | ICD-10-CM

## 2021-05-23 MED ORDER — AMPHETAMINE-DEXTROAMPHETAMINE 20 MG PO TABS: 20.0000 mg | ORAL_TABLET | Freq: Two times a day (BID) | ORAL | 0 refills | Status: DC

## 2021-05-24 NOTE — Telephone Encounter (Signed)
Refill sent 8/18.

## 2021-05-31 DIAGNOSIS — F439 Reaction to severe stress, unspecified: Secondary | ICD-10-CM | POA: Diagnosis not present

## 2021-06-12 ENCOUNTER — Encounter: Payer: Self-pay | Admitting: Family Medicine

## 2021-06-12 ENCOUNTER — Telehealth: Payer: BC Managed Care – PPO | Admitting: Family Medicine

## 2021-06-12 VITALS — Ht 66.25 in

## 2021-06-12 DIAGNOSIS — F419 Anxiety disorder, unspecified: Secondary | ICD-10-CM

## 2021-06-12 DIAGNOSIS — F902 Attention-deficit hyperactivity disorder, combined type: Secondary | ICD-10-CM

## 2021-06-12 DIAGNOSIS — F411 Generalized anxiety disorder: Secondary | ICD-10-CM

## 2021-06-12 MED ORDER — AMPHETAMINE-DEXTROAMPHET ER 20 MG PO CP24: 20.0000 mg | ORAL_CAPSULE | ORAL | 0 refills | Status: DC

## 2021-06-12 NOTE — Assessment & Plan Note (Addendum)
In general problem has been adequately controlled. She would like to try ER, we discussed a few options. She would like to try Adderall XR 20 mg daily. We discussed some side effects. She will let me know in a few days if she would like to continue XR or change back to Adderall IR. Continue CBT. Letter for school will be issued, so she can present to lab/school date of UTS. Follow-up in 3 to 4 months, before if needed.

## 2021-06-12 NOTE — Progress Notes (Signed)
Virtual Visit via Video Note I connected with Gina Sanders on 06/12/21 by a video enabled telemedicine application and verified that I am speaking with the correct person using two identifiers.  Location patient: home Location provider:work office Persons participating in the virtual visit: patient, provider  I discussed the limitations of evaluation and management by telemedicine and the availability of in person appointments. The patient expressed understanding and agreed to proceed.  Chief Complaint  Patient presents with   Medication Refill   HPI: Gina Sanders is a 25 yo female being seen today to follow-up on ADHD and anxiety. She was last seen on 02/06/2021. No new problems since her last visit. ADHD diagnosed in 10/2020. She has started Vyvanse 20 mg, which she did not feel like helping much and it was expensive.  She is currently on Adderall 20 mg twice daily, which she is still helping. 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 She has tolerated medication well. CBT once per week, every Wednesday.  She feels like this is helping.  She feels that she has a good social support. Frequently she has to ask for extension at school for test and projects/assignments completion. Still having difficulty with witting long assays, critical thinking.  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 Negative for illicit drug use.  Anxiety: Currently she is on fluoxetine 20 mg daily, which is still helping with anxiety.  Medication was started in 10/2020. Negative for depressed like symptoms.  She is also reporting great improvement now with sleep after getting a cervical pillow, which has helped tremendously with upper back and cervical pain.  ROS: See pertinent positives and negatives per HPI.  History reviewed. No  pertinent past medical history.  Past Surgical History:  Procedure Laterality Date   TONSILLECTOMY     Family History  Problem Relation Age of Onset   Mental retardation Father    Hyperlipidemia Maternal Grandfather    Social History   Socioeconomic History   Marital status: Single    Spouse name: Not on file   Number of children: Not on file   Years of education: Not on file   Highest education level: Not on file  Occupational History   Not on file  Tobacco Use   Smoking status: Never   Smokeless tobacco: Never  Substance and Sexual Activity   Alcohol use: No   Drug use: No   Sexual activity: Not on file  Other Topics Concern   Not on file  Social History Narrative   Work or School: Systems analyst, 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      Home Situation: lives with mom, dad and sister      Spiritual Beliefs: Christian      Lifestyle: dances daily; healthy diet            Social Determinants of Health   Financial Resource Strain: Not on file  Food Insecurity: Not on file  Transportation Needs: Not on file  Physical Activity: Not on file  Stress: Not on file  Social Connections: Not on file  Intimate Partner Violence: Not on file    Current Outpatient Medications:    amphetamine-dextroamphetamine (ADDERALL XR) 20 MG 24 hr capsule, Take 1 capsule (20 mg total) by mouth every  morning., Disp: 10 capsule, Rfl: 0   etonogestrel (NEXPLANON) 68 MG IMPL implant, 1 each by Subdermal route once. Implanted 05/22/2016 by [facility], Disp: , Rfl:    FLUoxetine (PROZAC) 20 MG capsule, TAKE 1 CAPSULE BY MOUTH EVERY DAY, Disp: 90 capsule, Rfl: 2   ketoconazole (NIZORAL) 2 % shampoo, Apply 1 application topically 2 (two) times a week., Disp: 120 mL, Rfl: 6   valACYclovir (VALTREX) 500 MG tablet, Take 1 tablet (500 mg total) by mouth daily., Disp: 90 tablet, Rfl: 2  EXAM:  VITALS per patient if applicable:Ht 5' 6.25" (1.683 m)   BMI 24.19 kg/m    GENERAL: alert, oriented, appears well and in no acute distress  HEENT: atraumatic, conjunctiva clear, no obvious abnormalities on inspection.  NECK: normal movements of the head and neck  LUNGS: on inspection no signs of respiratory distress, breathing rate appears normal, no obvious gross SOB, gasping or wheezing  CV: no obvious cyanosis  MS: moves all visible extremities without noticeable abnormality  PSYCH/NEURO: pleasant and cooperative, no obvious depression or anxiety, speech and thought processing grossly intact  ASSESSMENT AND PLAN:  Discussed the following assessment and plan:  Attention deficit hyperactivity disorder (ADHD), combined type - Plan: amphetamine-dextroamphetamine (ADDERALL XR) 20 MG 24 hr capsule  GAD (generalized anxiety disorder)  Anxiety disorder, unspecified type  Attention deficit hyperactivity disorder (ADHD), combined type In general problem has been adequately controlled. She would like to try ER, we discussed a few options. She would like to try Adderall XR 20 mg daily. We discussed some side effects. She will let me know in a few days if she would like to continue XR or change back to Adderall IR. Continue CBT. Letter for school will be issued, so she can present to lab/school date of UTS. Follow-up in 3 to 4 months, before if needed.  Anxiety disorder, unspecified Problem is stable, well controlled. Continue fluoxetine 20 mg daily and CBT.  I discussed the assessment and treatment plan with the patient. Gina Sanders was provided an opportunity to ask questions and all were answered. She greed with the plan and demonstrated an understanding of the instructions.  Return in about 15 weeks (around 09/25/2021) for ADHD and anxiety.  Buryl Bamber [place], MD

## 2021-06-12 NOTE — Assessment & Plan Note (Signed)
Problem is stable, well controlled. Continue fluoxetine 20 mg daily and CBT.

## 2021-06-17 ENCOUNTER — Ambulatory Visit: Payer: BC Managed Care – PPO | Admitting: Family Medicine

## 2021-06-17 ENCOUNTER — Encounter: Payer: Self-pay | Admitting: Family Medicine

## 2021-06-17 DIAGNOSIS — F902 Attention-deficit hyperactivity disorder, combined type: Secondary | ICD-10-CM

## 2021-06-19 MED ORDER — AMPHETAMINE-DEXTROAMPHET ER 20 MG PO CP24: 20.0000 mg | ORAL_CAPSULE | ORAL | 0 refills | Status: DC

## 2021-06-28 DIAGNOSIS — F439 Reaction to severe stress, unspecified: Secondary | ICD-10-CM | POA: Diagnosis not present

## 2021-07-03 DIAGNOSIS — F439 Reaction to severe stress, unspecified: Secondary | ICD-10-CM | POA: Diagnosis not present

## 2021-07-09 DIAGNOSIS — F439 Reaction to severe stress, unspecified: Secondary | ICD-10-CM | POA: Diagnosis not present

## 2021-07-16 DIAGNOSIS — F439 Reaction to severe stress, unspecified: Secondary | ICD-10-CM | POA: Diagnosis not present

## 2021-07-19 ENCOUNTER — Other Ambulatory Visit: Payer: Self-pay | Admitting: Family Medicine

## 2021-07-19 DIAGNOSIS — F902 Attention-deficit hyperactivity disorder, combined type: Secondary | ICD-10-CM

## 2021-07-22 MED ORDER — AMPHETAMINE-DEXTROAMPHET ER 20 MG PO CP24: 20.0000 mg | ORAL_CAPSULE | ORAL | 0 refills | Status: DC

## 2021-07-22 NOTE — Telephone Encounter (Signed)
Okay for refill?    LOV 06/12/2021 for ADHD   Last Refill     30  QTY.  0 Refills

## 2021-07-23 ENCOUNTER — Other Ambulatory Visit: Payer: Self-pay | Admitting: Family Medicine

## 2021-07-30 DIAGNOSIS — F439 Reaction to severe stress, unspecified: Secondary | ICD-10-CM | POA: Diagnosis not present

## 2021-08-06 DIAGNOSIS — F439 Reaction to severe stress, unspecified: Secondary | ICD-10-CM | POA: Diagnosis not present

## 2021-08-13 DIAGNOSIS — F439 Reaction to severe stress, unspecified: Secondary | ICD-10-CM | POA: Diagnosis not present

## 2021-08-20 ENCOUNTER — Encounter: Payer: Self-pay | Admitting: Family Medicine

## 2021-08-20 DIAGNOSIS — F439 Reaction to severe stress, unspecified: Secondary | ICD-10-CM | POA: Diagnosis not present

## 2021-08-27 ENCOUNTER — Telehealth (INDEPENDENT_AMBULATORY_CARE_PROVIDER_SITE_OTHER): Payer: BC Managed Care – PPO | Admitting: Family Medicine

## 2021-08-27 ENCOUNTER — Encounter: Payer: Self-pay | Admitting: Family Medicine

## 2021-08-27 VITALS — Ht 66.25 in

## 2021-08-27 DIAGNOSIS — F902 Attention-deficit hyperactivity disorder, combined type: Secondary | ICD-10-CM | POA: Diagnosis not present

## 2021-08-27 DIAGNOSIS — F411 Generalized anxiety disorder: Secondary | ICD-10-CM | POA: Diagnosis not present

## 2021-08-27 MED ORDER — AMPHETAMINE-DEXTROAMPHETAMINE 15 MG PO TABS: 15.0000 mg | ORAL_TABLET | Freq: Two times a day (BID) | ORAL | 0 refills | Status: DC

## 2021-08-27 NOTE — Assessment & Plan Note (Addendum)
Adderall XR caused insomnia. Adderall has helped but not as much as she would like. She would like to go back to IR Adderall but 15 mg bid. We discussed some side effects. Continue CBT.  Recommend [facility] with [facility], phone number given. They can take over ADHD treatment.

## 2021-08-27 NOTE — Progress Notes (Signed)
Virtual Visit via Video Note I connected with Gina Sanders on 08/27/21 by a video enabled telemedicine application and verified that I am speaking with the correct person using two identifiers.  Location patient: home Location provider:work office Persons participating in the virtual visit: patient, provider  I discussed the limitations of evaluation and management by telemedicine and the availability of in person appointments. The patient expressed understanding and agreed to proceed.  Chief Complaint  Patient presents with   Medication Management    Discuss changing ADHD medication   HPI: Gina Sanders is a 25 yo female with hx of ADHD and anxiety following on ADHD. She was last seen on 06/12/21, virtual. Last visit Adderall was changed from immediate release 20 mg bid to XR Adderall 20 mg. Medication was helping but was causing insomnia. She tried Vyvanse before, it is not help. She has tried different doses of Adderall, 10 mg and 20 mg.  She is also on CBT She has not taking Adderall for about 2 weeks, initially she was able to complete documentation at work/practice but now work load has increased and she is having difficulty with focusing/concentration, tasks completion,documentation, and organization.  She would like to try different dose of IR Adderall.  Negative for headache,abnormal wt loss, abdominal pain,N/V,tremors,or tics.  Anxiety on Fluoxetine 20 mg daily, which is still helping. Negative for depression.  ROS: See pertinent positives and negatives per HPI.  History reviewed. No pertinent past medical history.  Past Surgical History:  Procedure Laterality Date   TONSILLECTOMY      Family History  Problem Relation Age of Onset   Mental retardation Father    Hyperlipidemia Maternal Grandfather     Social History   Socioeconomic History   Marital status: Single    Spouse name: Not on file   Number of children: Not on file   Years of education: Not on file    Highest education level: Not on file  Occupational History   Not on file  Tobacco Use   Smoking status: Never   Smokeless tobacco: Never  Substance and Sexual Activity   Alcohol use: No   Drug use: No   Sexual activity: Not on file  Other Topics Concern   Not on file  Social History Narrative   Work or School: Systems analyst, 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      Home Situation: lives with mom, dad and sister      Spiritual Beliefs: Christian      Lifestyle: dances daily; healthy diet            Social Determinants of Health   Financial Resource Strain: Not on file  Food Insecurity: Not on file  Transportation Needs: Not on file  Physical Activity: Not on file  Stress: Not on file  Social Connections: Not on file  Intimate Partner Violence: Not on file    Current Outpatient Medications:    amphetamine-dextroamphetamine (ADDERALL) 15 MG tablet, Take 1 tablet by mouth in the morning and at bedtime., Disp: 60 tablet, Rfl: 0   etonogestrel (NEXPLANON) 68 MG IMPL implant, 1 each by Subdermal route once. Implanted 05/22/2016 by [facility], Disp: , Rfl:    FLUoxetine (PROZAC) 20 MG capsule, TAKE 1 CAPSULE BY MOUTH EVERY DAY, Disp: 90 capsule, Rfl: 2   ketoconazole (NIZORAL) 2 % shampoo, Apply 1 application topically 2 (two) times a week., Disp: 120 mL, Rfl: 6   valACYclovir (VALTREX) 500 MG tablet, TAKE 1  TABLET (500 MG TOTAL) BY MOUTH DAILY., Disp: 90 tablet, Rfl: 2  EXAM:  VITALS per patient if applicable:Ht 5' 6.25" (1.683 m)   BMI 24.19 kg/m   GENERAL: alert, oriented, appears well and in no acute distress  HEENT: atraumatic, conjunctiva clear, no obvious abnormalities on inspection.  NECK: normal movements of the head and neck  LUNGS: on inspection no signs of respiratory distress, breathing rate appears normal, no obvious gross SOB, gasping or wheezing  CV: no obvious cyanosis  MS: moves all visible extremities without noticeable  abnormality  PSYCH/NEURO: pleasant and cooperative, no obvious depression or anxiety, speech and thought processing grossly intact  ASSESSMENT AND PLAN:  Discussed the following assessment and plan:  Attention deficit hyperactivity disorder (ADHD), combined type Adderall XR caused insomnia. Adderall has helped but not as much as she would like. She would like to go back to IR Adderall but 15 mg bid. We discussed some side effects. Continue CBT.  Recommend [facility] with [facility], phone number given. They can take over ADHD treatment.  GAD (generalized anxiety disorder) Problem is stable. Continue Fluoxetine 20 mg daily. Continue CBT.  I discussed the assessment and treatment plan with the patient. [name] was provided an opportunity to ask questions and all were answered. She agreed with the plan and demonstrated an understanding of the instructions.  Return if symptoms worsen or fail to improve, for Will contrinue following with attention specialist clinic.  [name]. [place], MD  [facility]. [place] office.

## 2021-08-30 ENCOUNTER — Encounter: Payer: Self-pay | Admitting: Family Medicine

## 2021-09-10 DIAGNOSIS — F439 Reaction to severe stress, unspecified: Secondary | ICD-10-CM | POA: Diagnosis not present

## 2021-09-17 DIAGNOSIS — F439 Reaction to severe stress, unspecified: Secondary | ICD-10-CM | POA: Diagnosis not present

## 2021-09-25 DIAGNOSIS — F439 Reaction to severe stress, unspecified: Secondary | ICD-10-CM | POA: Diagnosis not present

## 2021-09-27 ENCOUNTER — Encounter: Payer: Self-pay | Admitting: Family Medicine

## 2021-09-27 DIAGNOSIS — F902 Attention-deficit hyperactivity disorder, combined type: Secondary | ICD-10-CM

## 2021-10-01 MED ORDER — AMPHETAMINE-DEXTROAMPHETAMINE 15 MG PO TABS: 15.0000 mg | ORAL_TABLET | Freq: Two times a day (BID) | ORAL | 0 refills | Status: DC

## 2021-10-11 DIAGNOSIS — F439 Reaction to severe stress, unspecified: Secondary | ICD-10-CM | POA: Diagnosis not present

## 2021-10-18 DIAGNOSIS — F439 Reaction to severe stress, unspecified: Secondary | ICD-10-CM | POA: Diagnosis not present

## 2021-10-21 DIAGNOSIS — M25522 Pain in left elbow: Secondary | ICD-10-CM | POA: Diagnosis not present

## 2021-11-01 ENCOUNTER — Encounter: Payer: Self-pay | Admitting: Family Medicine

## 2021-11-01 DIAGNOSIS — F902 Attention-deficit hyperactivity disorder, combined type: Secondary | ICD-10-CM

## 2021-11-01 DIAGNOSIS — F439 Reaction to severe stress, unspecified: Secondary | ICD-10-CM | POA: Diagnosis not present

## 2021-11-06 MED ORDER — AMPHETAMINE-DEXTROAMPHETAMINE 15 MG PO TABS: ORAL_TABLET | ORAL | 0 refills | Status: DC

## 2021-11-08 DIAGNOSIS — F439 Reaction to severe stress, unspecified: Secondary | ICD-10-CM | POA: Diagnosis not present

## 2021-11-15 DIAGNOSIS — F439 Reaction to severe stress, unspecified: Secondary | ICD-10-CM | POA: Diagnosis not present

## 2021-11-22 DIAGNOSIS — F439 Reaction to severe stress, unspecified: Secondary | ICD-10-CM | POA: Diagnosis not present

## 2021-11-29 DIAGNOSIS — F439 Reaction to severe stress, unspecified: Secondary | ICD-10-CM | POA: Diagnosis not present

## 2021-12-03 NOTE — Progress Notes (Deleted)
? ? ?  ACUTE VISIT ?No chief complaint on file. ? ?HPI: ?[name] is a 26 y.o. female, who is here today complaining of *** ?HPI ? ?Review of Systems ?Rest see pertinent positives and negatives per HPI. ? ?Current Outpatient Medications on File Prior to Visit  ?Medication Sig Dispense Refill  ? amphetamine-dextroamphetamine (ADDERALL) 15 MG tablet 1 tab in the morning and if needed in the afternoon. Pending appt to establish with ADHD clinic. 60 tablet 0  ? etonogestrel (NEXPLANON) 68 MG IMPL implant 1 each by Subdermal route once. Implanted 05/22/2016 by [facility]    ? FLUoxetine (PROZAC) 20 MG capsule TAKE 1 CAPSULE BY MOUTH EVERY DAY 90 capsule 2  ? ketoconazole (NIZORAL) 2 % shampoo Apply 1 application topically 2 (two) times a week. 120 mL 6  ? valACYclovir (VALTREX) 500 MG tablet TAKE 1 TABLET (500 MG TOTAL) BY MOUTH DAILY. 90 tablet 2  ? ?No current facility-administered medications on file prior to visit.  ? ? ? ?No past medical history on file. ?Allergies  ?Allergen Reactions  ? Sulfa Antibiotics   ? ? ?Social History  ? ?Socioeconomic History  ? Marital status: Single  ?  Spouse name: Not on file  ? Number of children: Not on file  ? Years of education: Not on file  ? Highest education level: Not on file  ?Occupational History  ? Not on file  ?Tobacco Use  ? Smoking status: Never  ? Smokeless tobacco: Never  ?Substance and Sexual Activity  ? Alcohol use: No  ? Drug use: No  ? Sexual activity: Not on file  ?Other Topics Concern  ? Not on file  ?Social History Narrative  ? 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   ? Home Situation: lives with mom, dad and sister  ?   ? Spiritual Beliefs: Christian  ?   ? Lifestyle: dances daily; healthy diet  ?   ?   ?   ? ?Social Determinants of Health  ? ?Financial Resource Strain: Not on file  ?Food Insecurity: Not on file  ?Transportation Needs: Not on file  ?Physical Activity: Not on file  ?Stress: Not on file   ?Social Connections: Not on file  ? ? ?There were no vitals filed for this visit. ?There is no height or weight on file to calculate BMI. ? ?Physical Exam ? ?ASSESSMENT AND PLAN: ? ?There are no diagnoses linked to this encounter. ? ? ?No follow-ups on file. ? ? ?Betty G. Martinique, MD ? ?[place]. ?[place] office. ? ?Discharge Instructions   ?None ?  ? ? ? ? ? ? ? ? ? ? ? ? ?

## 2021-12-04 ENCOUNTER — Ambulatory Visit (INDEPENDENT_AMBULATORY_CARE_PROVIDER_SITE_OTHER): Payer: BC Managed Care – PPO

## 2021-12-04 ENCOUNTER — Ambulatory Visit: Payer: BC Managed Care – PPO | Admitting: Family Medicine

## 2021-12-04 ENCOUNTER — Encounter: Payer: Self-pay | Admitting: Family Medicine

## 2021-12-04 ENCOUNTER — Other Ambulatory Visit: Payer: Self-pay

## 2021-12-04 VITALS — BP 120/80 | HR 83 | Resp 16 | Ht 66.25 in | Wt 195.4 lb

## 2021-12-04 DIAGNOSIS — F902 Attention-deficit hyperactivity disorder, combined type: Secondary | ICD-10-CM

## 2021-12-04 DIAGNOSIS — Z1159 Encounter for screening for other viral diseases: Secondary | ICD-10-CM

## 2021-12-04 DIAGNOSIS — M545 Low back pain, unspecified: Secondary | ICD-10-CM

## 2021-12-04 DIAGNOSIS — G8929 Other chronic pain: Secondary | ICD-10-CM | POA: Diagnosis not present

## 2021-12-04 DIAGNOSIS — R5383 Other fatigue: Secondary | ICD-10-CM | POA: Diagnosis not present

## 2021-12-04 DIAGNOSIS — F419 Anxiety disorder, unspecified: Secondary | ICD-10-CM

## 2021-12-04 DIAGNOSIS — E559 Vitamin D deficiency, unspecified: Secondary | ICD-10-CM | POA: Diagnosis not present

## 2021-12-04 DIAGNOSIS — M722 Plantar fascial fibromatosis: Secondary | ICD-10-CM

## 2021-12-04 DIAGNOSIS — E669 Obesity, unspecified: Secondary | ICD-10-CM

## 2021-12-04 DIAGNOSIS — Z6831 Body mass index (BMI) 31.0-31.9, adult: Secondary | ICD-10-CM

## 2021-12-04 DIAGNOSIS — Z0189 Encounter for other specified special examinations: Secondary | ICD-10-CM

## 2021-12-04 LAB — CBC
HCT: 40.5 % (ref 36.0–46.0)
Hemoglobin: 13.7 g/dL (ref 12.0–15.0)
MCHC: 33.8 g/dL (ref 30.0–36.0)
MCV: 95.4 fl (ref 78.0–100.0)
Platelets: 321 10*3/uL (ref 150.0–400.0)
RBC: 4.25 Mil/uL (ref 3.87–5.11)
RDW: 12.7 % (ref 11.5–15.5)
WBC: 5.1 10*3/uL (ref 4.0–10.5)

## 2021-12-04 LAB — TSH: TSH: 1.55 u[IU]/mL (ref 0.35–5.50)

## 2021-12-04 LAB — COMPREHENSIVE METABOLIC PANEL
ALT: 16 U/L (ref 0–35)
AST: 17 U/L (ref 0–37)
Albumin: 4.5 g/dL (ref 3.5–5.2)
Alkaline Phosphatase: 89 U/L (ref 39–117)
BUN: 13 mg/dL (ref 6–23)
CO2: 27 mEq/L (ref 19–32)
Calcium: 9.1 mg/dL (ref 8.4–10.5)
Chloride: 102 mEq/L (ref 96–112)
Creatinine, Ser: 0.77 mg/dL (ref 0.40–1.20)
GFR: 107.16 mL/min (ref >60.00)
Glucose, Bld: 89 mg/dL (ref 70–99)
Potassium: 4.3 mEq/L (ref 3.5–5.1)
Sodium: 136 mEq/L (ref 135–145)
Total Bilirubin: 0.3 mg/dL (ref 0.2–1.2)
Total Protein: 7.4 g/dL (ref 6.0–8.3)

## 2021-12-04 LAB — SEDIMENTATION RATE: Sed Rate: 15 mm/hr (ref 0–20)

## 2021-12-04 LAB — C-REACTIVE PROTEIN: CRP: <1 mg/dL (ref 0.5–20.0)

## 2021-12-04 LAB — VITAMIN B12: Vitamin B-12: 311 pg/mL (ref 211–911)

## 2021-12-04 LAB — VITAMIN D 25 HYDROXY (VIT D DEFICIENCY, FRACTURES): VITD: 24.18 ng/mL — ABNORMAL LOW (ref 30.00–100.00)

## 2021-12-04 MED ORDER — MELOXICAM 15 MG PO TABS: 15.0000 mg | ORAL_TABLET | Freq: Every day | ORAL | 0 refills | Status: DC | PRN

## 2021-12-04 NOTE — Assessment & Plan Note (Signed)
Adderall caused insomnia and she did not feel like Ritalin helped greatly. ?For now she prefers to continue non pharmacologic treatment. ?Continue CBT. ?

## 2021-12-04 NOTE — Assessment & Plan Note (Signed)
Otherwise stable. ?Dealing with stress well. ?Continue Fluoxetine 20 mg daily and CBT. ? ?

## 2021-12-04 NOTE — Assessment & Plan Note (Signed)
We discussed benefits of wt loss as well as adverse effects of obesity. ?Consistency with healthy diet and physical activity encouraged. ?She acknowledges she is eating more added sugar foods, so will try to decrease intake. ? ?

## 2021-12-04 NOTE — Progress Notes (Signed)
ACUTE VISIT Chief Complaint  Patient presents with   Back Pain    Lower back/spine, has minor scoliosis    Plantar Fasciitis   HPI: Gina Sanders is a 26 y.o. female, who is here today with above complaints. Bilateral lower back pain she has had for many years. Hx of scoliosis, she would like to have imaging done because has not had any in years. 07/2013 lumbar X ON:[id number] dextroscoliosis.  Pain is not radiated, achy/dull like, mild to moderate. Now it is mild. Pain is not associated with LE numbness, tingling, urinary incontinence or retention, stool incontinence, or saddle anesthesia.  Exacerbated by long walks,prolonged standing. Alleviated by Rest.   OTC medications: Ibuprofen.  She is also concerned about wt gain. She has not change her diet but adds that she eats a "decent amount" of sugary foods during the day. She is not exercising but has been more active during school clinical rotations. Developing stretch marks around waist and thighs.  + Fatigue, she would like vits check. Problem has been going on since 06/2021 and worse at the end of the day. She is taking vit D supplementation 1000 U daily. She is sleeping 8-9 hours.  ADHD and anxiety, she stopped Adderall, it was affecting her sleep. She feels like she is performing well at school. She has tried Vyvanse and ritalin. She is on Fluoxetine 20 mg daily for anxiety. Negative for depressed mood. Last follow up 08/27/21.  A couple to 4 weeks of plantar foot pain, L>R. Heel and arch. No hx of trauma. Worse when she first start walking in the morning or after prolonged sitting, alleviated after a few steps. 3-4/10 and can be 8/10 at the end the day. Pretibial bilateral soreness for 2-4 weeks. Negative for edema, erythema. Occasional anterior knee pain intermittent for a while. When getting up after prolonged sitting. No joint edema or erythema.  Review of Systems  Constitutional:  Negative for activity  change, appetite change and fever.  HENT:  Negative for mouth sores, nosebleeds and sore throat.   Respiratory:  Negative for cough, shortness of breath and wheezing.   Cardiovascular:  Negative for chest pain and palpitations.  Gastrointestinal:  Negative for abdominal pain, nausea and vomiting.       Negative for changes in bowel habits.  Genitourinary:  Negative for decreased urine volume, dysuria and hematuria.  Neurological:  Negative for syncope, weakness and headaches.  Hematological:  Negative for adenopathy. Does not bruise/bleed easily.  Psychiatric/Behavioral:  Negative for confusion. The patient is nervous/anxious.   Rest see pertinent positives and negatives per HPI.  Current Outpatient Medications on File Prior to Visit  Medication Sig Dispense Refill   amphetamine-dextroamphetamine (ADDERALL) 15 MG tablet 1 tab in the morning and if needed in the afternoon. Pending appt to establish with ADHD clinic. 60 tablet 0   etonogestrel (NEXPLANON) 68 MG IMPL implant 1 each by Subdermal route once. Implanted 05/22/2016 by [facility]     FLUoxetine (PROZAC) 20 MG capsule TAKE 1 CAPSULE BY MOUTH EVERY DAY 90 capsule 2   valACYclovir (VALTREX) 500 MG tablet TAKE 1 TABLET (500 MG TOTAL) BY MOUTH DAILY. 90 tablet 2   No current facility-administered medications on file prior to visit.    History reviewed. No pertinent past medical history. 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  Physical Exam Vitals and nursing note reviewed.  Constitutional:      General: She is not in acute distress.    Appearance: She is well-developed.  HENT:     Head: Normocephalic and atraumatic.     Mouth/Throat:     Mouth: Mucous membranes are moist.     Pharynx: Oropharynx is clear.  Eyes:     Conjunctiva/sclera: Conjunctivae normal.  Neck:     Thyroid: No thyromegaly.  Cardiovascular:     Rate and Rhythm: Normal rate and regular rhythm.     Pulses:          Dorsalis pedis pulses are 2+ on the right side and 2+ on the left side.     Heart sounds: No murmur heard. Pulmonary:     Effort: Pulmonary effort is normal. No respiratory distress.     Breath sounds: Normal breath sounds.  Abdominal:     Palpations: Abdomen is soft. There is no hepatomegaly or mass.     Tenderness: There is no abdominal tenderness.  Musculoskeletal:     Lumbar back: Tenderness present. Negative right straight leg raise test and negative left straight leg raise test.     Right knee: No deformity, erythema or crepitus. Normal range of motion. Normal patellar mobility.     Instability Tests: Anterior drawer test negative. Posterior drawer test negative.     Left knee: No deformity, erythema or crepitus. Normal range of motion. Normal  patellar mobility.     Instability Tests: Anterior drawer test negative. Posterior drawer test negative.     Comments: L>R foot:Tenderness upon palpation of heel mainly at the medial insertion of plantar fascia into calcaneous. Also mild discomfort with palpation along planta fascia towards forefoot. No bunion  Dorsal flexion of first MTP does not elicits pain.  No edema or erythema appreciated on area.  No signs of synovitis.  Lymphadenopathy:     Cervical: No cervical adenopathy.  Skin:    General: Skin is warm.     Findings: No erythema or rash.  Neurological:     General: No focal deficit present.  Mental Status: She is alert and oriented to person, place, and time.     Cranial Nerves: No cranial nerve deficit.     Gait: Gait normal.  Psychiatric:        Mood and Affect: Mood is anxious.     Comments: Well groomed, good eye contact.   ASSESSMENT AND PLAN:  Morticia was seen today for back pain and plantar fasciitis.  Diagnoses and all orders for this visit: Orders Placed This Encounter  Procedures   DG Lumbar Spine Complete   Comprehensive metabolic panel   VITAMIN D 25 Hydroxy (Vit-D Deficiency, Fractures)   Vitamin B12   CBC   C-reactive protein   Sedimentation rate   TSH   Hepatitis C antibody   Lab Results  Component Value Date   TSH 1.55 12/04/2021   Lab Results  Component Value Date   ESRSEDRATE 15 12/04/2021   Lab Results  Component Value Date   CRP <1.0 12/04/2021   Lab Results  Component Value Date   CREATININE 0.77 12/04/2021   BUN 13 12/04/2021   NA 136 12/04/2021   K 4.3 12/04/2021   CL 102 12/04/2021   CO2 27 12/04/2021   Lab Results  Component Value Date   ALT 16 12/04/2021   AST 17 12/04/2021   ALKPHOS 89 12/04/2021   BILITOT 0.3 12/04/2021   Lab Results  Component Value Date   VITAMINB12 311 12/04/2021   Lab Results  Component Value Date   WBC 5.1 12/04/2021   HGB 13.7 12/04/2021   HCT 40.5 12/04/2021   MCV 95.4  12/04/2021   PLT 321.0 12/04/2021   Other fatigue We discussed possible etiologies: Systemic illness, immunologic,endocrinology,sleep disorder, psychiatric/psychologic, infectious,medications side effects, and idiopathic. Examination today does not suggest a serious process.  Further recommendations will be given according to lab results.  Chronic bilateral low back pain without sciatica Stable otherwise. Last lumbar X ray in 07/2013.. We could consider sport medicine evaluation of problem gets worse. Meloxicam prescribed for foot pain will also help with this problem.  -     meloxicam (MOBIC) 15 MG tablet; Take 1 tablet (15 mg total) by mouth daily as needed for pain.  Patient request for diagnostic testing -     VITAMIN D 25 Hydroxy (Vit-D Deficiency, Fractures); Future -     Vitamin B12; Future -     Vitamin B12 -     VITAMIN D 25 Hydroxy (Vit-D Deficiency, Fractures)  Plantar fasciitis We discussed Dx.prognosis,and treatment options. Stretching exercises, comfortable shoes,inserts,and night splints recommended. If not improved podiatrist evaluation may be necessary to discussed local steroid injection. Wt loss will also help.  -     meloxicam (MOBIC) 15 MG tablet; Take 1 tablet (15 mg total) by mouth daily as needed for pain.  Encounter for HCV screening test for low risk patient -     Hepatitis C antibody  Attention deficit hyperactivity disorder (ADHD), combined type Adderall caused insomnia and she did not feel like Ritalin helped greatly. For now she prefers to continue non pharmacologic treatment. Continue CBT.  Anxiety disorder, unspecified Otherwise stable. Dealing with stress well. Continue Fluoxetine 20 mg daily and CBT.   Class 1 obesity with body mass index (BMI) of 31.0 to 31.9 in adult We discussed benefits of wt loss as well as adverse effects of obesity. Consistency with healthy diet and physical activity encouraged. She acknowledges she is eating  more added sugar foods, so will try to decrease intake.  Vitamin D insufficiency Vit D mildly low. Will recommend continuing Vit D supplementation. See lab result note.  I spent a total of 45 minutes in both face to face and non face to face activities for this visit on the date of this encounter. During this time history was obtained and documented, examination was performed, prior labs/imaging reviewed, and assessment/plan discussed. She understands some of labs she requested today will not be covered by her health insurance.  Return if symptoms worsen or fail to improve.  Destyn Parfitt G. Martinique, MD  [facility]. [place] office.

## 2021-12-04 NOTE — Patient Instructions (Addendum)
A few things to remember from today's visit:   Chronic bilateral low back pain without sciatica - Plan: DG Lumbar Spine Complete, meloxicam (MOBIC) 15 MG tablet  Other fatigue - Plan: Comprehensive metabolic panel, CBC, C-reactive protein, Sedimentation rate, TSH  Patient request for diagnostic testing - Plan: VITAMIN D 25 Hydroxy (Vit-D Deficiency, Fractures), Vitamin B12  Encounter for HCV screening test for low risk patient - Plan: Hepatitis C antibody  Plantar fasciitis - Plan: meloxicam (MOBIC) 15 MG tablet  Do not use My Chart to request refills or for acute issues that need immediate attention.   Please be sure medication list is accurate. If a new problem present, please set up appointment sooner than planned today. Visit with podiatrist may be necessary of foot pain is not better.  Fatigue If you have fatigue, you feel tired all the time and have a lack of energy or a lack of motivation. Fatigue may make it difficult to start or complete tasks because of exhaustion. In general, occasional or mild fatigue is often a normal response to activity or life. However, long-lasting (chronic) or extreme fatigue may be a symptom of a medical condition. Follow these instructions at home: General instructions Watch your fatigue for any changes. Go to bed and get up at the same time every day. Avoid fatigue by pacing yourself during the day and getting enough sleep at night. Maintain a healthy weight. Medicines Take over-the-counter and prescription medicines only as told by your health care provider. Take a multivitamin, if told by your health care provider.  Do not use herbal or dietary supplements unless they are approved by your health care provider. Activity  Exercise regularly, as told by your health care provider. Use or practice techniques to help you relax, such as yoga, tai chi, meditation, or massage therapy. Eating and drinking  Avoid heavy meals in the evening. Eat a  well-balanced diet, which includes lean proteins, whole grains, plenty of fruits and vegetables, and low-fat dairy products. Avoid consuming too much caffeine. Avoid the use of alcohol. Drink enough fluid to keep your urine pale yellow. Lifestyle Change situations that cause you stress. Try to keep your work and personal schedule in balance. Do not use any products that contain nicotine or tobacco, such as cigarettes and e-cigarettes. If you need help quitting, ask your health care provider. Do not use drugs. Contact a health care provider if: Your fatigue does not get better. You have a fever. You suddenly lose or gain weight. You have headaches. You have trouble falling asleep or sleeping through the night. You feel angry, guilty, anxious, or sad. You are unable to have a bowel movement (constipation). Your skin is dry. You have swelling in your legs or another part of your body. Get help right away if: You feel confused. Your vision is blurry. You feel faint or you pass out. You have a severe headache. You have severe pain in your abdomen, your back, or the area between your waist and hips (pelvis). You have chest pain, shortness of breath, or an irregular or fast heartbeat. You are unable to urinate, or you urinate less than normal. You have abnormal bleeding, such as bleeding from the rectum, vagina, nose, lungs, or nipples. You vomit blood. You have thoughts about hurting yourself or others. If you ever feel like you may hurt yourself or others, or have thoughts about taking your own life, get help right away. You can go to your nearest emergency department or call: Your  local emergency services (911 in the [place] A suicide crisis helpline, such as the [facility] at (404) 470-2995 or 988 in the [place]. This is open 24 hours a day. Summary If you have fatigue, you feel tired all the time and have a lack of energy or a lack of motivation. Fatigue may make  it difficult to start or complete tasks because of exhaustion. Long-lasting (chronic) or extreme fatigue may be a symptom of a medical condition. Exercise regularly, as told by your health care provider. Change situations that cause you stress. Try to keep your work and personal schedule in balance. This information is not intended to replace advice given to you by your health care provider. Make sure you discuss any questions you have with your health care provider. Document Revised: 04/17/2021 Document Reviewed: 08/02/2020 Elsevier Patient Education  2022 [employer].  Plantar Fasciitis Plantar fasciitis is a painful foot condition that affects the heel. It occurs when the band of tissue that connects the toes to the heel bone (plantar fascia) becomes irritated. This can happen as the result of exercising too much or doing other repetitive activities (overuse injury). Plantar fasciitis can cause mild irritation to severe pain that makes it difficult to walk or move. The pain is usually worse in the morning after sleeping, or after sitting or lying down for a period of time. Pain may also be worse after long periods of walking or standing. What are the causes? This condition may be caused by: Standing for long periods of time. Wearing shoes that do not have good arch support. Doing activities that put stress on joints (high-impact activities). This includes ballet and exercise that makes your heart beat faster (aerobic exercise), such as running. Being overweight. An abnormal way of walking (gait). Tight muscles in the back of your lower leg (calf). High arches in your feet or flat feet. Starting a new athletic activity. What are the signs or symptoms? The main symptom of this condition is heel pain. Pain may get worse after the following: Taking the first steps after a time of rest, especially in the morning after awakening, or after you have been sitting or lying down for a while. Long  periods of standing still. Pain may decrease after 30-45 minutes of activity, such as gentle walking. How is this diagnosed? This condition may be diagnosed based on your medical history, a physical exam, and your symptoms. Your health care provider will check for: A tender area on the bottom of your foot. A high arch in your foot or flat feet. Pain when you move your foot. Difficulty moving your foot. You may have imaging tests to confirm the diagnosis, such as: X-rays. Ultrasound. MRI. How is this treated? Treatment for plantar fasciitis depends on how severe your condition is. Treatment may include: Rest, ice, pressure (compression), and raising (elevating) the affected foot. This is called RICE therapy. Your health care provider may recommend RICE therapy along with over-the-counter pain medicines to manage your pain. Exercises to stretch your calves and your plantar fascia. A splint that holds your foot in a stretched, upward position while you sleep (night splint). Physical therapy to relieve symptoms and prevent problems in the future. Injections of steroid medicine (cortisone) to relieve pain and inflammation. Stimulating your plantar fascia with electrical impulses (extracorporeal shock wave therapy). This is usually the last treatment option before surgery. Surgery, if other treatments have not worked after 12 months. Follow these instructions at home: Managing pain, stiffness, and  swelling  If directed, put ice on the painful area. To do this: Put ice in a plastic bag, or use a frozen bottle of water. Place a towel between your skin and the bag or bottle. Roll the bottom of your foot over the bag or bottle. Do this for 20 minutes, 2-3 times a day. Wear athletic shoes that have air-sole or gel-sole cushions, or try soft shoe inserts that are designed for plantar fasciitis. Elevate your foot above the level of your heart while you are sitting or lying down. Activity Avoid  activities that cause pain. Ask your health care provider what activities are safe for you. Do physical therapy exercises and stretches as told by your health care provider. Try activities and forms of exercise that are easier on your joints (low impact). Examples include swimming, water aerobics, and biking. General instructions Take over-the-counter and prescription medicines only as told by your health care provider. Wear a night splint while sleeping, if told by your health care provider. Loosen the splint if your toes tingle, become numb, or turn cold and blue. Maintain a healthy weight, or work with your health care provider to lose weight as needed. Keep all follow-up visits. This is important. Contact a health care provider if you have: Symptoms that do not go away with home treatment. Pain that gets worse. Pain that affects your ability to move or do daily activities. Summary Plantar fasciitis is a painful foot condition that affects the heel. It occurs when the band of tissue that connects the toes to the heel bone (plantar fascia) becomes irritated. Heel pain is the main symptom of this condition. It may get worse after exercising too much or standing still for a long time. Treatment varies, but it usually starts with rest, ice, pressure (compression), and raising (elevating) the affected foot. This is called RICE therapy. Over-the-counter medicines can also be used to manage pain. This information is not intended to replace advice given to you by your health care provider. Make sure you discuss any questions you have with your health care provider. Document Revised: 01/09/2020 Document Reviewed: 01/09/2020 Elsevier Patient Education  2022 [employer].

## 2021-12-04 NOTE — Assessment & Plan Note (Addendum)
Vit D mildly low. ?Will recommend continuing Vit D supplementation. ?See lab result note. ?

## 2021-12-05 LAB — HEPATITIS C ANTIBODY
Hepatitis C Ab: NONREACTIVE
SIGNAL TO CUT-OFF: <0.02 (ref <1.00)

## 2021-12-06 DIAGNOSIS — F439 Reaction to severe stress, unspecified: Secondary | ICD-10-CM | POA: Diagnosis not present

## 2021-12-25 DIAGNOSIS — F439 Reaction to severe stress, unspecified: Secondary | ICD-10-CM | POA: Diagnosis not present

## 2021-12-30 ENCOUNTER — Encounter: Payer: Self-pay | Admitting: Family Medicine

## 2021-12-31 ENCOUNTER — Other Ambulatory Visit: Payer: Self-pay | Admitting: Family Medicine

## 2021-12-31 DIAGNOSIS — M545 Low back pain, unspecified: Secondary | ICD-10-CM

## 2021-12-31 DIAGNOSIS — M722 Plantar fascial fibromatosis: Secondary | ICD-10-CM

## 2022-01-06 ENCOUNTER — Other Ambulatory Visit: Payer: Self-pay | Admitting: Family Medicine

## 2022-01-06 DIAGNOSIS — F902 Attention-deficit hyperactivity disorder, combined type: Secondary | ICD-10-CM

## 2022-01-06 MED ORDER — AMPHETAMINE-DEXTROAMPHETAMINE 15 MG PO TABS: ORAL_TABLET | ORAL | 0 refills | Status: DC

## 2022-01-10 DIAGNOSIS — F439 Reaction to severe stress, unspecified: Secondary | ICD-10-CM | POA: Diagnosis not present

## 2022-01-17 DIAGNOSIS — F439 Reaction to severe stress, unspecified: Secondary | ICD-10-CM | POA: Diagnosis not present

## 2022-01-24 DIAGNOSIS — F439 Reaction to severe stress, unspecified: Secondary | ICD-10-CM | POA: Diagnosis not present

## 2022-02-09 ENCOUNTER — Other Ambulatory Visit: Payer: Self-pay | Admitting: Family Medicine

## 2022-02-09 DIAGNOSIS — M545 Low back pain, unspecified: Secondary | ICD-10-CM

## 2022-02-09 DIAGNOSIS — M722 Plantar fascial fibromatosis: Secondary | ICD-10-CM

## 2022-02-12 DIAGNOSIS — F439 Reaction to severe stress, unspecified: Secondary | ICD-10-CM | POA: Diagnosis not present

## 2022-02-19 DIAGNOSIS — F439 Reaction to severe stress, unspecified: Secondary | ICD-10-CM | POA: Diagnosis not present

## 2022-02-22 DIAGNOSIS — J069 Acute upper respiratory infection, unspecified: Secondary | ICD-10-CM | POA: Diagnosis not present

## 2022-02-26 DIAGNOSIS — F439 Reaction to severe stress, unspecified: Secondary | ICD-10-CM | POA: Diagnosis not present

## 2022-03-02 ENCOUNTER — Other Ambulatory Visit: Payer: Self-pay | Admitting: Family Medicine

## 2022-03-05 DIAGNOSIS — F439 Reaction to severe stress, unspecified: Secondary | ICD-10-CM | POA: Diagnosis not present

## 2022-03-12 DIAGNOSIS — F439 Reaction to severe stress, unspecified: Secondary | ICD-10-CM | POA: Diagnosis not present

## 2022-03-15 ENCOUNTER — Other Ambulatory Visit: Payer: Self-pay | Admitting: Family Medicine

## 2022-03-15 DIAGNOSIS — M722 Plantar fascial fibromatosis: Secondary | ICD-10-CM

## 2022-03-15 DIAGNOSIS — G8929 Other chronic pain: Secondary | ICD-10-CM

## 2022-03-19 DIAGNOSIS — F439 Reaction to severe stress, unspecified: Secondary | ICD-10-CM | POA: Diagnosis not present

## 2022-03-26 DIAGNOSIS — F439 Reaction to severe stress, unspecified: Secondary | ICD-10-CM | POA: Diagnosis not present

## 2022-04-02 DIAGNOSIS — F439 Reaction to severe stress, unspecified: Secondary | ICD-10-CM | POA: Diagnosis not present

## 2022-04-09 DIAGNOSIS — F439 Reaction to severe stress, unspecified: Secondary | ICD-10-CM | POA: Diagnosis not present

## 2022-04-16 DIAGNOSIS — F439 Reaction to severe stress, unspecified: Secondary | ICD-10-CM | POA: Diagnosis not present

## 2022-04-23 DIAGNOSIS — F439 Reaction to severe stress, unspecified: Secondary | ICD-10-CM | POA: Diagnosis not present

## 2022-04-29 DIAGNOSIS — F439 Reaction to severe stress, unspecified: Secondary | ICD-10-CM | POA: Diagnosis not present

## 2022-05-13 ENCOUNTER — Other Ambulatory Visit: Payer: Self-pay | Admitting: Family Medicine

## 2022-05-14 DIAGNOSIS — R69 Illness, unspecified: Secondary | ICD-10-CM | POA: Diagnosis not present

## 2022-05-21 DIAGNOSIS — R69 Illness, unspecified: Secondary | ICD-10-CM | POA: Diagnosis not present

## 2022-05-22 DIAGNOSIS — Z01419 Encounter for gynecological examination (general) (routine) without abnormal findings: Secondary | ICD-10-CM | POA: Diagnosis not present

## 2022-05-22 DIAGNOSIS — N393 Stress incontinence (female) (male): Secondary | ICD-10-CM | POA: Diagnosis not present

## 2022-06-04 DIAGNOSIS — R69 Illness, unspecified: Secondary | ICD-10-CM | POA: Diagnosis not present

## 2022-06-11 DIAGNOSIS — R69 Illness, unspecified: Secondary | ICD-10-CM | POA: Diagnosis not present

## 2022-06-18 DIAGNOSIS — R69 Illness, unspecified: Secondary | ICD-10-CM | POA: Diagnosis not present

## 2022-06-20 DIAGNOSIS — N393 Stress incontinence (female) (male): Secondary | ICD-10-CM | POA: Diagnosis not present

## 2022-06-24 DIAGNOSIS — N393 Stress incontinence (female) (male): Secondary | ICD-10-CM | POA: Diagnosis not present

## 2022-07-01 DIAGNOSIS — N393 Stress incontinence (female) (male): Secondary | ICD-10-CM | POA: Diagnosis not present

## 2022-07-02 DIAGNOSIS — R69 Illness, unspecified: Secondary | ICD-10-CM | POA: Diagnosis not present

## 2022-07-04 ENCOUNTER — Ambulatory Visit: Payer: Self-pay | Admitting: Family Medicine

## 2022-07-07 ENCOUNTER — Ambulatory Visit: Payer: Self-pay | Admitting: Family Medicine

## 2022-07-07 NOTE — Progress Notes (Deleted)
ACUTE VISIT No chief complaint on file.  HPI: [name] is a 26 y.o. female, who is here today complaining of *** HPI  Review of Systems Rest see pertinent positives and negatives per HPI.  Current Outpatient Medications on File Prior to Visit  Medication Sig Dispense Refill   amphetamine-dextroamphetamine (ADDERALL) 15 MG tablet 1 tab in the morning and if needed in the afternoon. Pending appt to establish with ADHD clinic. 60 tablet 0   etonogestrel (NEXPLANON) 68 MG IMPL implant 1 each by Subdermal route once. Implanted 05/22/2016 by [facility]     FLUoxetine (PROZAC) 20 MG capsule TAKE 1 CAPSULE BY MOUTH EVERY DAY 90 capsule 2   meloxicam (MOBIC) 15 MG tablet TAKE 1 TABLET BY MOUTH EVERY DAY AS NEEDED FOR PAIN 30 tablet 0   valACYclovir (VALTREX) 500 MG tablet TAKE 1 TABLET (500 MG TOTAL) BY MOUTH DAILY. 90 tablet 2   No current facility-administered medications on file prior to visit.     No past medical history on file. 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      Home Situation: lives with mom, dad and sister      Spiritual Beliefs: Christian      Lifestyle: dances daily; healthy diet            Social Determinants of Health   Financial Resource Strain: Low Risk  (07/03/2022)   Overall Financial Resource Strain (CARDIA)    Difficulty of Paying  Living Expenses: Not hard at all  Food Insecurity: No Food Insecurity (07/03/2022)   Hunger Vital Sign    Worried About Running Out of Food in the Last Year: Never true    [place] in the Last Year: Never true  Transportation Needs: No Transportation Needs (07/03/2022)   PRAPARE - Hydrologist (Medical): No    Lack of Transportation (Non-Medical): No  Physical Activity: Insufficiently Active (07/03/2022)   Exercise Vital Sign    Days of Exercise per Week: 1 day    Minutes of Exercise per Session: 40 min  Stress: Stress Concern Present (07/03/2022)   [place]    Feeling of Stress : Very much  Social Connections: Moderately Isolated (07/03/2022)   Social Connection and Isolation Panel [NHANES]    Frequency of Communication with Friends and Family: More than three times a week    Frequency of Social Gatherings with Friends and Family: Twice a week    Attends Religious Services: Never    Marine scientist or Organizations: No    Attends Music therapist: Not on file    Marital Status: Living with partner    There were  no vitals filed for this visit. There is no height or weight on file to calculate BMI.  Physical Exam  ASSESSMENT AND PLAN:  There are no diagnoses linked to this encounter.   No follow-ups on file.   Betty G. Martinique, MD  [facility]. [place] office.  Discharge Instructions   None

## 2022-07-09 DIAGNOSIS — R69 Illness, unspecified: Secondary | ICD-10-CM | POA: Diagnosis not present

## 2022-07-15 DIAGNOSIS — N393 Stress incontinence (female) (male): Secondary | ICD-10-CM | POA: Diagnosis not present

## 2022-07-16 DIAGNOSIS — R69 Illness, unspecified: Secondary | ICD-10-CM | POA: Diagnosis not present

## 2022-07-22 DIAGNOSIS — Z3046 Encounter for surveillance of implantable subdermal contraceptive: Secondary | ICD-10-CM | POA: Diagnosis not present

## 2022-07-23 DIAGNOSIS — R69 Illness, unspecified: Secondary | ICD-10-CM | POA: Diagnosis not present

## 2022-07-24 DIAGNOSIS — N393 Stress incontinence (female) (male): Secondary | ICD-10-CM | POA: Diagnosis not present

## 2022-07-29 DIAGNOSIS — N393 Stress incontinence (female) (male): Secondary | ICD-10-CM | POA: Diagnosis not present

## 2022-07-30 DIAGNOSIS — R69 Illness, unspecified: Secondary | ICD-10-CM | POA: Diagnosis not present

## 2022-08-06 DIAGNOSIS — R69 Illness, unspecified: Secondary | ICD-10-CM | POA: Diagnosis not present

## 2022-08-07 DIAGNOSIS — N393 Stress incontinence (female) (male): Secondary | ICD-10-CM | POA: Diagnosis not present

## 2022-08-12 DIAGNOSIS — N393 Stress incontinence (female) (male): Secondary | ICD-10-CM | POA: Diagnosis not present

## 2022-08-13 DIAGNOSIS — R69 Illness, unspecified: Secondary | ICD-10-CM | POA: Diagnosis not present

## 2022-08-13 NOTE — Progress Notes (Unsigned)
ACUTE VISIT Chief Complaint  Patient presents with   Abdominal Pain    Under right breast, started again 2-3 months ago and has been constant; worse in a flexed position.    HPI: [name] is a 26 y.o. female, who is here today complaining of *** HPI The patient presents with a chief complaint of dull pain below the breast on the right side, which has been present for a year but has become more frequent in the past two to three months. The pain is not related to the menstrual period and is described as a constant dull ache with intermittent tingling sensations, particularly when something is on top of the area for too long, such as a bra. The pain is rated as a 3 or 4 out of 10 in intensity.  She reports that the pain is worse when not moving around and in bad posture. The pain does not worsen with eating or not eating, and there are no associated symptoms of nausea or vomiting. She denies any back pain, cough, difficulty breathing, or wheezing. She has tried ibuprofen, naproxen, ice, and heat for pain relief, but these interventions have not provided significant relief.  The patient is currently taking fluoxetine and has discontinued Adderall. She has not received this year's flu shot and is interested in getting it. Review of Systems Rest see pertinent positives and negatives per HPI.  Current Outpatient Medications on File Prior to Visit  Medication Sig Dispense Refill   amphetamine-dextroamphetamine (ADDERALL) 15 MG tablet 1 tab in the morning and if needed in the afternoon. Pending appt to establish with ADHD clinic. 60 tablet 0   etonogestrel (NEXPLANON) 68 MG IMPL implant 1 each by Subdermal route once. Implanted 05/22/2016 by [facility]     FLUoxetine (PROZAC) 20 MG capsule TAKE 1 CAPSULE BY MOUTH EVERY DAY 90 capsule 2   meloxicam (MOBIC) 15 MG tablet TAKE 1 TABLET BY MOUTH EVERY DAY AS NEEDED FOR PAIN 30 tablet 0   valACYclovir (VALTREX) 500 MG tablet TAKE 1 TABLET  (500 MG TOTAL) BY MOUTH DAILY. 90 tablet 2   No current facility-administered medications on file prior to visit.     History reviewed. No pertinent past medical history. 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      Home Situation: lives with mom, dad and sister      Spiritual Beliefs: Christian      Lifestyle: dances daily; healthy diet            Social Determinants of Health   Financial Resource Strain: Low Risk  (07/03/2022)   Overall Financial Resource Strain (CARDIA)    Difficulty of Paying Living Expenses: Not hard at all  Food Insecurity: No Food Insecurity (07/03/2022)  Hunger Vital Sign    Worried About Running Out of Food in the Last Year: Never true    Ran Out of Food in the Last Year: Never true  Transportation Needs: No Transportation Needs (07/03/2022)   PRAPARE - Administrator, Civil Service (Medical): No    Lack of Transportation (Non-Medical): No  Physical Activity: Insufficiently Active (07/03/2022)   Exercise Vital Sign    Days of Exercise per Week: 1 day    Minutes of Exercise per Session: 40 min  Stress: Stress Concern Present (07/03/2022)   [employer] of Occupational Health - Occupational Stress Questionnaire    Feeling of Stress : Very much  Social Connections: Moderately Isolated (07/03/2022)   Social Connection and Isolation  Panel [NHANES]    Frequency of Communication with Friends and Family: More than three times a week    Frequency of Social Gatherings with Friends and Family: Twice a week    Attends Religious Services: Never    Database administrator or Organizations: No    Attends Banker Meetings: Not on file    Marital Status: Living with partner    Vitals:   08/15/22 1553  BP: 128/80  Pulse: 60  Temp: 97.6 F (36.4 C)  SpO2: 98%   Body mass index is 35.28 kg/m.  Physical Exam Vitals and nursing note reviewed.  Constitutional:      General: She is not in acute distress.    Appearance: She is well-developed.  HENT:     Head: Normocephalic and atraumatic.     Mouth/Throat:     Mouth: Mucous membranes are moist.     Pharynx: Oropharynx is clear.  Eyes:     Conjunctiva/sclera: Conjunctivae normal.  Cardiovascular:     Rate and Rhythm: Normal rate and regular rhythm.     Heart sounds: No murmur heard. Pulmonary:     Effort: Pulmonary effort is normal. No respiratory distress.     Breath sounds: Normal breath sounds.  Chest:    Abdominal:     Palpations: Abdomen is soft. There is no hepatomegaly or mass.     Tenderness: There is no abdominal tenderness.  Lymphadenopathy:     Cervical: No cervical adenopathy.  Skin:    General: Skin is warm.     Findings: No erythema or rash.  Neurological:     General: No focal deficit present.     Mental Status: She is alert and oriented to person, place, and time.     Cranial Nerves: No cranial nerve deficit.     Gait: Gait normal.  Psychiatric:     Comments: Well groomed, good eye contact.     ASSESSMENT AND PLAN:  There are no diagnoses linked to this encounter.  No follow-ups on file.   Edin Skarda G. [place], MD  [facility]. [place] office.

## 2022-08-15 ENCOUNTER — Ambulatory Visit: Payer: 59 | Admitting: Family Medicine

## 2022-08-15 ENCOUNTER — Encounter: Payer: Self-pay | Admitting: Family Medicine

## 2022-08-15 VITALS — BP 128/80 | HR 60 | Temp 97.6°F | Resp 12 | Ht 66.25 in | Wt 220.2 lb

## 2022-08-15 DIAGNOSIS — M792 Neuralgia and neuritis, unspecified: Secondary | ICD-10-CM

## 2022-08-15 DIAGNOSIS — F411 Generalized anxiety disorder: Secondary | ICD-10-CM

## 2022-08-15 DIAGNOSIS — Z23 Encounter for immunization: Secondary | ICD-10-CM

## 2022-08-15 DIAGNOSIS — R0781 Pleurodynia: Secondary | ICD-10-CM | POA: Diagnosis not present

## 2022-08-15 DIAGNOSIS — R69 Illness, unspecified: Secondary | ICD-10-CM | POA: Diagnosis not present

## 2022-08-15 MED ORDER — LIDOCAINE 5 % EX PTCH: 1.0000 | MEDICATED_PATCH | CUTANEOUS | 1 refills | Status: DC

## 2022-08-15 NOTE — Patient Instructions (Addendum)
A few things to remember from today's visit:  Costal margin pain  Neuropathic pain - Plan: lidocaine (LIDODERM) 5 %  Pain could be musculoskeletal or nerve like pain. If worse we can consider thoracic MRI. Gabapentin is an option we can try after you are done with school tests. Lidocaine patch daily recommended for now.  If you need refills for medications you take chronically, please call your pharmacy. Do not use My Chart to request refills or for acute issues that need immediate attention. If you send a my chart message, it may take a few days to be addressed, specially if I am not in the office.  Please be sure medication list is accurate. If a new problem present, please set up appointment sooner than planned today.

## 2022-08-16 ENCOUNTER — Encounter: Payer: Self-pay | Admitting: Family Medicine

## 2022-08-18 NOTE — Progress Notes (Signed)
This encounter was created in error - please disregard.

## 2022-08-26 DIAGNOSIS — N393 Stress incontinence (female) (male): Secondary | ICD-10-CM | POA: Diagnosis not present

## 2022-08-27 DIAGNOSIS — R69 Illness, unspecified: Secondary | ICD-10-CM | POA: Diagnosis not present

## 2022-09-04 ENCOUNTER — Encounter: Payer: Self-pay | Admitting: Family Medicine

## 2022-09-04 DIAGNOSIS — M792 Neuralgia and neuritis, unspecified: Secondary | ICD-10-CM

## 2022-09-05 MED ORDER — LIDOCAINE 5 % EX PTCH: 1.0000 | MEDICATED_PATCH | CUTANEOUS | 1 refills | Status: AC

## 2022-09-05 MED ORDER — FLUOXETINE HCL 20 MG PO CAPS: ORAL_CAPSULE | ORAL | 2 refills | Status: DC

## 2022-09-10 DIAGNOSIS — R69 Illness, unspecified: Secondary | ICD-10-CM | POA: Diagnosis not present

## 2022-09-24 ENCOUNTER — Encounter: Payer: Self-pay | Admitting: Family Medicine

## 2022-09-24 DIAGNOSIS — M545 Low back pain, unspecified: Secondary | ICD-10-CM

## 2022-09-24 DIAGNOSIS — R0781 Pleurodynia: Secondary | ICD-10-CM

## 2022-09-24 DIAGNOSIS — M792 Neuralgia and neuritis, unspecified: Secondary | ICD-10-CM

## 2022-10-01 ENCOUNTER — Telehealth: Payer: Self-pay | Admitting: Family Medicine

## 2022-10-01 NOTE — Progress Notes (Signed)
Patient has tried six weeks of therapy with the lidocaine patches without relief. Will move on to ordering Thoracic MRI per pcp.

## 2022-10-01 NOTE — Telephone Encounter (Signed)
Pt is scheduled for an MRI tomorrow afternoon and is requesting a call back, because she has some questions before the procedure.  Please return her call asap - [phone]

## 2022-10-01 NOTE — Telephone Encounter (Signed)
See my chart encounter.

## 2022-10-02 ENCOUNTER — Ambulatory Visit (HOSPITAL_BASED_OUTPATIENT_CLINIC_OR_DEPARTMENT_OTHER)
Admission: RE | Admit: 2022-10-02 | Discharge: 2022-10-02 | Disposition: A | Discharge status: Home / Self Care | Payer: 59 | Source: Ambulatory Visit | Attending: Family Medicine | Admitting: Family Medicine

## 2022-10-02 DIAGNOSIS — M792 Neuralgia and neuritis, unspecified: Secondary | ICD-10-CM | POA: Insufficient documentation

## 2022-10-02 DIAGNOSIS — M545 Low back pain, unspecified: Secondary | ICD-10-CM | POA: Insufficient documentation

## 2022-10-02 DIAGNOSIS — G8929 Other chronic pain: Secondary | ICD-10-CM | POA: Diagnosis not present

## 2022-10-02 DIAGNOSIS — J90 Pleural effusion, not elsewhere classified: Secondary | ICD-10-CM | POA: Diagnosis not present

## 2022-10-02 DIAGNOSIS — R0781 Pleurodynia: Secondary | ICD-10-CM | POA: Insufficient documentation

## 2022-10-13 ENCOUNTER — Encounter: Payer: Self-pay | Admitting: Family Medicine

## 2022-10-15 DIAGNOSIS — R69 Illness, unspecified: Secondary | ICD-10-CM | POA: Diagnosis not present

## 2022-10-27 NOTE — Progress Notes (Signed)
ACUTE VISIT Chief Complaint  Patient presents with   inflammatory issues   HPI: [name] is a 27 y.o. female with PMHx significant for anxiety, ADHD,and vit D insufficiency here today complaining of persistent pain in the right rib cage, which has been ongoing since her last visit, 08/15/22.At this time she reported double pain with intermittent tingling like sensation on right anterior rib cage that has been going on for about 3 months. Because of persistency of pain, which thought to be radicular, thoracic MRI was obtained: 1. No osseous abnormality of the thoracic spine. 2. Small bilateral pleural effusions. 3. No disc herniation, spinal canal stenosis or neural impingement.  She reports that the pain has spread to her left side and chest. She has pain in the sternum area, particularly while in bed, sometimes pain wakes her up.  Pain intensity is rated as a 4 out of 10.  She denies experiencing any fever, chills, abnormal weight loss, night sweats, oral lesions, cough,SOB, wheezing, abdominal pain, nausea, vomiting, or skin rash.  She has been taking Tylenol for pain management, she is not sure if this medication helps. She denies any joint pain and states that her sleep is mostly fine, except for the sternum pain that occasionally disturbs her sleep.  She has been taking OTC Prilosec daily as needed for acid reflux, which has been present for a few months.  She acknowledges that stress and anxiety may be contributing factors to her symptoms, particularly due to a recent family history of lung cancer.  Her mother has a history of RA, which was diagnosed later in life.   She is concerned about a systemic inflammatory process, she would like blood work done today.  Review of Systems  Constitutional:  Negative for activity change, appetite change and diaphoresis.  Respiratory:  Negative for cough and wheezing.   Cardiovascular:  Negative for palpitations and leg swelling.   Endocrine: Negative for cold intolerance and heat intolerance.  Genitourinary:  Negative for decreased urine volume, dysuria and hematuria.  Neurological:  Negative for syncope, weakness and numbness.  Psychiatric/Behavioral:  Negative for confusion. The patient is nervous/anxious.   See other pertinent positives and negatives in HPI.  Current Outpatient Medications on File Prior to Visit  Medication Sig Dispense Refill   etonogestrel (NEXPLANON) 68 MG IMPL implant 1 each by Subdermal route once. Implanted 05/22/2016 by [facility]     FLUoxetine (PROZAC) 20 MG capsule TAKE 1 CAPSULE BY MOUTH EVERY DAY 90 capsule 2   lidocaine (LIDODERM) 5 % Place 1 patch onto the skin daily. Remove & Discard patch within 12 hours or as directed by MD 30 patch 1   meloxicam (MOBIC) 15 MG tablet TAKE 1 TABLET BY MOUTH EVERY DAY AS NEEDED FOR PAIN 30 tablet 0   valACYclovir (VALTREX) 500 MG tablet TAKE 1 TABLET (500 MG TOTAL) BY MOUTH DAILY. 90 tablet 2   No current facility-administered medications on file prior to visit.   History reviewed. No pertinent past medical history. 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      Home Situation: lives with mom, dad and sister      Spiritual Beliefs: Christian      Lifestyle: dances daily; healthy diet            Social Determinants of Health    Financial Resource Strain: Low Risk  (07/03/2022)   Overall Financial Resource Strain (CARDIA)    Difficulty of Paying Living Expenses: Not hard at all  Food Insecurity: No Food Insecurity (07/03/2022)   Hunger Vital Sign    Worried About Running Out of Food in the Last Year: Never true    [place] in the Last Year: Never true  Transportation Needs: No Transportation Needs (07/03/2022)   PRAPARE - Hydrologist (Medical): No    Lack of Transportation (Non-Medical): No  Physical Activity: Insufficiently Active (07/03/2022)   Exercise Vital Sign    Days of Exercise per Week: 1 day    Minutes of Exercise per Session: 40 min  Stress: Stress Concern Present (07/03/2022)   [place]    Feeling of Stress : Very much  Social Connections: Moderately Isolated (07/03/2022)   Social Connection and Isolation Panel [NHANES]    Frequency of Communication with Friends and Family: More than three times a week    Frequency of Social Gatherings with Friends and Family: Twice a week    Attends Religious Services: Never    Marine scientist or Organizations: No    Attends Archivist Meetings: Not on file    Marital Status: Living with partner   Vitals:   10/28/22 0804  BP: 126/70  Pulse: 97  Resp: 12  Temp: 97.7 F (36.5 C)  SpO2: 98%   Body mass index is 36.54 kg/m.  Physical Exam Vitals and nursing note reviewed.  Constitutional:      General: She is not in acute distress.    Appearance: She is well-developed.  HENT:     Head: Normocephalic and atraumatic.     Mouth/Throat:     Mouth: Mucous membranes are moist.     Pharynx: Oropharynx is clear.  Eyes:     Conjunctiva/sclera: Conjunctivae normal.  Neck:     Thyroid: No thyroid mass.  Cardiovascular:     Rate and Rhythm: Normal rate and regular rhythm.     Heart sounds: No murmur heard. Pulmonary:     Effort:  Pulmonary effort is normal. No respiratory distress.     Breath sounds: Normal breath sounds.  Chest:     Chest wall: Tenderness (Pain with palpation of right rib cage,anterior and lateral; chest wall, and left parasternal area.) present.  Abdominal:     Palpations: Abdomen is soft. There is no hepatomegaly or mass.     Tenderness: There is no abdominal tenderness.  Musculoskeletal:     Comments: No signs of synovitis.  Lymphadenopathy:     Cervical: No cervical adenopathy.  Skin:    General: Skin is warm.     Findings: No erythema or rash.  Neurological:     General: No focal deficit present.     Mental Status: She is  alert and oriented to person, place, and time.     Cranial Nerves: No cranial nerve deficit.     Gait: Gait normal.  Psychiatric:     Comments: Well groomed, good eye contact.   ASSESSMENT AND PLAN: Gina Sanders was seen today for inflammatory issues.  Diagnoses and all orders for this visit: Orders Placed This Encounter  Procedures   US Abdomen Limited RUQ (LIVER/GB)   ANA   CBC   C-reactive protein   Sedimentation rate   TSH   Comprehensive metabolic panel   Lab Results  Component Value Date   ESRSEDRATE 24 (H) 10/28/2022   Lab Results  Component Value Date   CRP <1.0 10/28/2022   Lab Results  Component Value Date   WBC 6.2 10/28/2022   HGB 13.9 10/28/2022   HCT 40.4 10/28/2022   MCV 91.5 10/28/2022   PLT 299.0 10/28/2022   Lab Results  Component Value Date   TSH 2.44 10/28/2022   Lab Results  Component Value Date   CREATININE 0.69 10/28/2022   BUN 9 10/28/2022   NA 137 10/28/2022   K 3.9 10/28/2022   CL 107 10/28/2022   CO2 21 10/28/2022   Lab Results  Component Value Date   ALT 16 10/28/2022   AST 15 10/28/2022   ALKPHOS 106 10/28/2022   BILITOT 0.4 10/28/2022   Gastroesophageal reflux disease Problem is not well-controlled, this can certainly be contributing to her chest wall pain. Recommend omeprazole 40 mg 30-minute before  breakfast for 6 to 8 weeks. Continue GERD precautions.  Pleural effusion Incidental finding on thoracic MRI, small. We have reviewed possible etiologies, given her age and no other associated symptoms, it seems to be benign and I do not think further imaging is needed at this time. Rheumatologic workup ordered today.  Chest wall pain We discussed possible etiologies, he seems to be musculoskeletal.  Fibromyalgia is to be considered. If problem continues, we could consider chest CT. Monitor for new symptoms. Further recommendation will be given according to lab results.  Costal margin pain Around RUQ/rib cage. I still think it is mostly musculoskeletal pain but because pain is getting worse and now radiating to lateral aspect, RUQ [place] will be arranged.  RUQ pain -     US Abdomen Limited RUQ (LIVER/GB); Future  Return in about 2 months (around 12/27/2022).  Severa Jeremiah G. [place], MD  [facility]. [place] office.

## 2022-10-28 ENCOUNTER — Encounter: Payer: Self-pay | Admitting: Family Medicine

## 2022-10-28 ENCOUNTER — Ambulatory Visit: Payer: 59 | Admitting: Family Medicine

## 2022-10-28 VITALS — BP 126/70 | HR 97 | Temp 97.7°F | Resp 12 | Ht 66.25 in | Wt 228.1 lb

## 2022-10-28 DIAGNOSIS — J90 Pleural effusion, not elsewhere classified: Secondary | ICD-10-CM | POA: Diagnosis not present

## 2022-10-28 DIAGNOSIS — K219 Gastro-esophageal reflux disease without esophagitis: Secondary | ICD-10-CM | POA: Diagnosis not present

## 2022-10-28 DIAGNOSIS — R1011 Right upper quadrant pain: Secondary | ICD-10-CM | POA: Diagnosis not present

## 2022-10-28 DIAGNOSIS — R0789 Other chest pain: Secondary | ICD-10-CM

## 2022-10-28 DIAGNOSIS — R0781 Pleurodynia: Secondary | ICD-10-CM | POA: Diagnosis not present

## 2022-10-28 LAB — COMPREHENSIVE METABOLIC PANEL
ALT: 16 U/L (ref 0–35)
AST: 15 U/L (ref 0–37)
Albumin: 4.3 g/dL (ref 3.5–5.2)
Alkaline Phosphatase: 106 U/L (ref 39–117)
BUN: 9 mg/dL (ref 6–23)
CO2: 21 mEq/L (ref 19–32)
Calcium: 8.8 mg/dL (ref 8.4–10.5)
Chloride: 107 mEq/L (ref 96–112)
Creatinine, Ser: 0.69 mg/dL (ref 0.40–1.20)
GFR: 119.81 mL/min (ref >60.00)
Glucose, Bld: 97 mg/dL (ref 70–99)
Potassium: 3.9 mEq/L (ref 3.5–5.1)
Sodium: 137 mEq/L (ref 135–145)
Total Bilirubin: 0.4 mg/dL (ref 0.2–1.2)
Total Protein: 7.2 g/dL (ref 6.0–8.3)

## 2022-10-28 LAB — C-REACTIVE PROTEIN: CRP: <1 mg/dL (ref 0.5–20.0)

## 2022-10-28 LAB — CBC
HCT: 40.4 % (ref 36.0–46.0)
Hemoglobin: 13.9 g/dL (ref 12.0–15.0)
MCHC: 34.5 g/dL (ref 30.0–36.0)
MCV: 91.5 fl (ref 78.0–100.0)
Platelets: 299 10*3/uL (ref 150.0–400.0)
RBC: 4.42 Mil/uL (ref 3.87–5.11)
RDW: 13.3 % (ref 11.5–15.5)
WBC: 6.2 10*3/uL (ref 4.0–10.5)

## 2022-10-28 LAB — SEDIMENTATION RATE: Sed Rate: 24 mm/hr — ABNORMAL HIGH (ref 0–20)

## 2022-10-28 LAB — TSH: TSH: 2.44 u[IU]/mL (ref 0.35–5.50)

## 2022-10-28 MED ORDER — OMEPRAZOLE 40 MG PO CPDR: 40.0000 mg | DELAYED_RELEASE_CAPSULE | Freq: Every day | ORAL | 0 refills | Status: AC

## 2022-10-28 NOTE — Assessment & Plan Note (Signed)
Problem is not well-controlled, this can certainly be contributing to her chest wall pain. Recommend omeprazole 40 mg 30-minute before breakfast for 6 to 8 weeks. Continue GERD precautions.

## 2022-10-28 NOTE — Patient Instructions (Addendum)
A few things to remember from today's visit:  Chest wall pain - Plan: ANA, CBC, Comprehensive metabolic panel  Costal margin pain  Pleural effusion - Plan: ANA, CBC, C-reactive protein, Sedimentation rate, TSH, Comprehensive metabolic panel  RUQ pain - Plan: US Abdomen Limited RUQ (LIVER/GB)  Gastroesophageal reflux disease, unspecified whether esophagitis present - Plan: omeprazole (PRILOSEC) 40 MG capsule  For now continue monitoring for new symptoms.  If you need refills for medications you take chronically, please call your pharmacy. Do not use My Chart to request refills or for acute issues that need immediate attention. If you send a my chart message, it may take a few days to be addressed, specially if I am not in the office.  Please be sure medication list is accurate. If a new problem present, please set up appointment sooner than planned today.

## 2022-10-28 NOTE — Assessment & Plan Note (Signed)
Incidental finding on thoracic MRI, small. We have reviewed possible etiologies, given her age and no other associated symptoms, it seems to be benign and I do not think further imaging is needed at this time. Rheumatologic workup ordered today.

## 2022-10-29 LAB — ANA: Anti Nuclear Antibody (ANA): NEGATIVE

## 2022-11-10 ENCOUNTER — Encounter: Payer: Self-pay | Admitting: Family Medicine

## 2022-11-12 DIAGNOSIS — R69 Illness, unspecified: Secondary | ICD-10-CM | POA: Diagnosis not present

## 2022-11-12 MED ORDER — FLUOXETINE HCL 20 MG PO CAPS: ORAL_CAPSULE | ORAL | 2 refills | Status: AC

## 2022-11-15 ENCOUNTER — Ambulatory Visit (HOSPITAL_BASED_OUTPATIENT_CLINIC_OR_DEPARTMENT_OTHER)
Admission: RE | Admit: 2022-11-15 | Discharge: 2022-11-15 | Disposition: A | Discharge status: Home / Self Care | Payer: 59 | Source: Ambulatory Visit | Attending: Family Medicine | Admitting: Family Medicine

## 2022-11-15 DIAGNOSIS — R1011 Right upper quadrant pain: Secondary | ICD-10-CM | POA: Insufficient documentation

## 2022-11-26 DIAGNOSIS — R69 Illness, unspecified: Secondary | ICD-10-CM | POA: Diagnosis not present

## 2022-12-24 DIAGNOSIS — F439 Reaction to severe stress, unspecified: Secondary | ICD-10-CM | POA: Diagnosis not present

## 2022-12-26 ENCOUNTER — Ambulatory Visit: Payer: 59 | Admitting: Family Medicine

## 2023-01-06 IMAGING — DX DG CHEST 2V
2 series · 2 of 2 positions shown · non-contrast
Comparison: None.

CLINICAL DATA: Left-sided chest pain

EXAM:
CHEST - 2 VIEW

[chest pa]
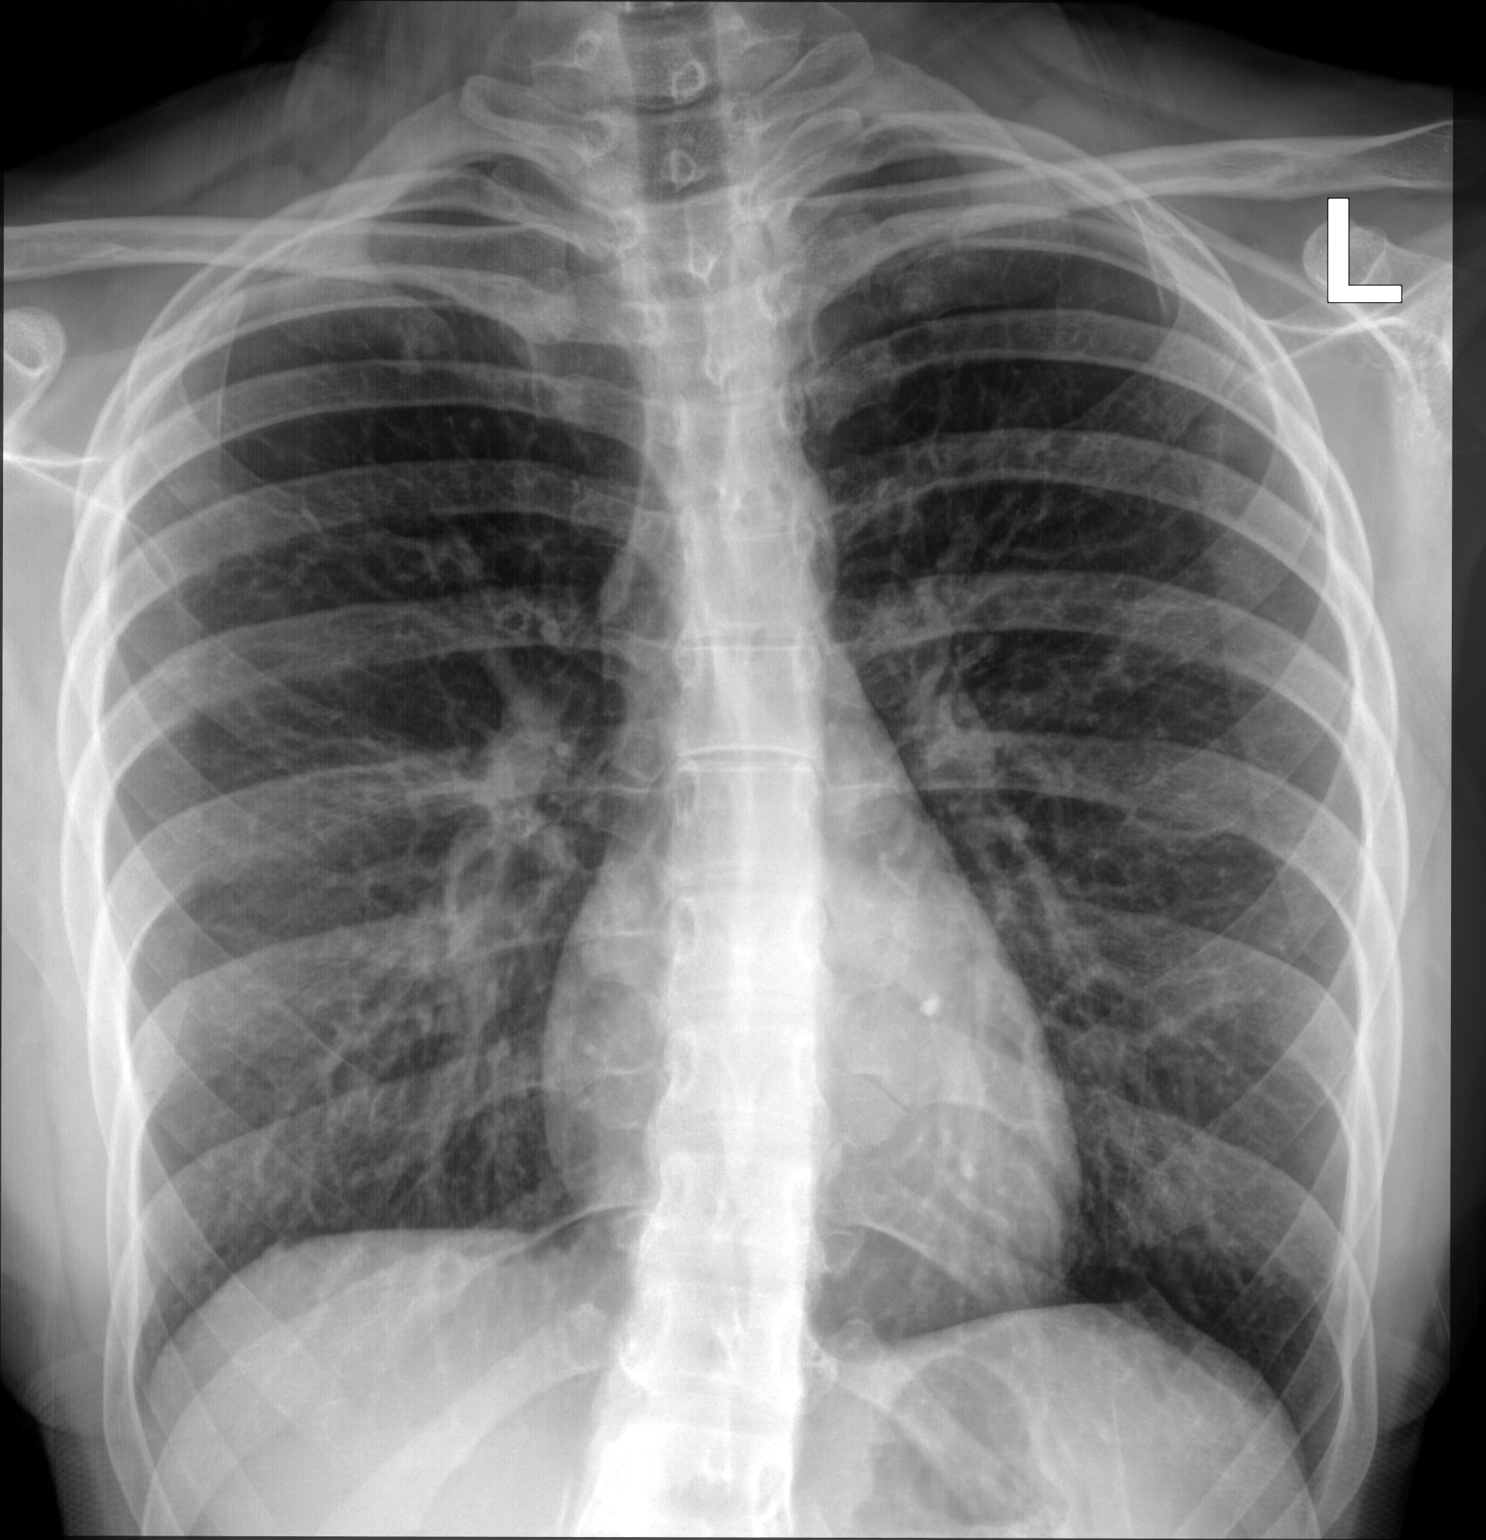

[chest lat]
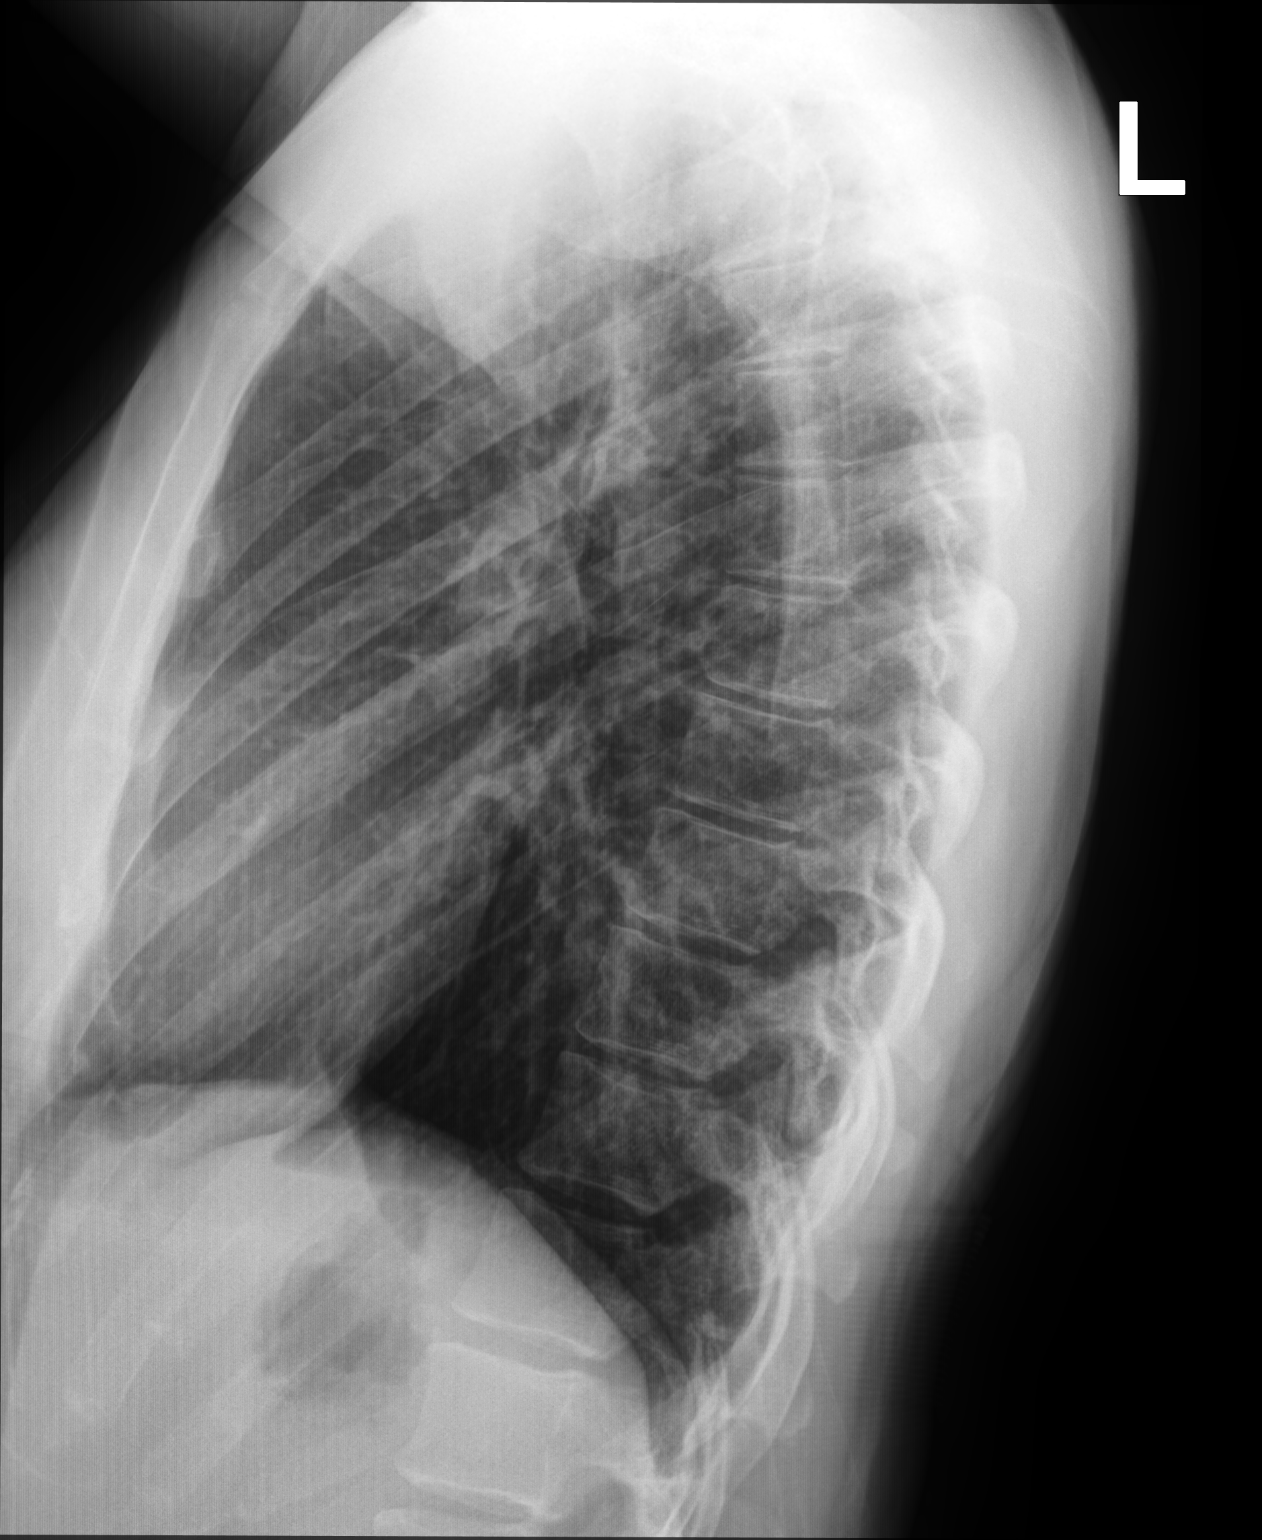

[2 of 2 positions shown; findings below may reference images not displayed]

FINDINGS: The heart size and mediastinal contours are within normal limits.
Both lungs are clear. The visualized skeletal structures are
unremarkable.
IMPRESSION: No active cardiopulmonary disease.

## 2023-01-07 DIAGNOSIS — F439 Reaction to severe stress, unspecified: Secondary | ICD-10-CM | POA: Diagnosis not present

## 2023-01-21 DIAGNOSIS — F439 Reaction to severe stress, unspecified: Secondary | ICD-10-CM | POA: Diagnosis not present

## 2023-02-04 DIAGNOSIS — F439 Reaction to severe stress, unspecified: Secondary | ICD-10-CM | POA: Diagnosis not present

## 2023-02-18 DIAGNOSIS — F439 Reaction to severe stress, unspecified: Secondary | ICD-10-CM | POA: Diagnosis not present

## 2023-03-04 DIAGNOSIS — F439 Reaction to severe stress, unspecified: Secondary | ICD-10-CM | POA: Diagnosis not present

## 2023-03-18 DIAGNOSIS — F439 Reaction to severe stress, unspecified: Secondary | ICD-10-CM | POA: Diagnosis not present

## 2023-04-01 DIAGNOSIS — F439 Reaction to severe stress, unspecified: Secondary | ICD-10-CM | POA: Diagnosis not present

## 2023-04-15 DIAGNOSIS — F439 Reaction to severe stress, unspecified: Secondary | ICD-10-CM | POA: Diagnosis not present

## 2023-04-29 DIAGNOSIS — F439 Reaction to severe stress, unspecified: Secondary | ICD-10-CM | POA: Diagnosis not present

## 2023-05-13 DIAGNOSIS — F439 Reaction to severe stress, unspecified: Secondary | ICD-10-CM | POA: Diagnosis not present

## 2023-05-27 DIAGNOSIS — F439 Reaction to severe stress, unspecified: Secondary | ICD-10-CM | POA: Diagnosis not present

## 2023-06-10 DIAGNOSIS — F439 Reaction to severe stress, unspecified: Secondary | ICD-10-CM | POA: Diagnosis not present

## 2023-06-24 DIAGNOSIS — F439 Reaction to severe stress, unspecified: Secondary | ICD-10-CM | POA: Diagnosis not present

## 2023-07-08 DIAGNOSIS — F439 Reaction to severe stress, unspecified: Secondary | ICD-10-CM | POA: Diagnosis not present

## 2023-07-22 DIAGNOSIS — F439 Reaction to severe stress, unspecified: Secondary | ICD-10-CM | POA: Diagnosis not present

## 2023-08-05 DIAGNOSIS — F439 Reaction to severe stress, unspecified: Secondary | ICD-10-CM | POA: Diagnosis not present

## 2023-08-11 DIAGNOSIS — F439 Reaction to severe stress, unspecified: Secondary | ICD-10-CM | POA: Diagnosis not present

## 2023-08-18 DIAGNOSIS — F439 Reaction to severe stress, unspecified: Secondary | ICD-10-CM | POA: Diagnosis not present

## 2023-09-01 DIAGNOSIS — F439 Reaction to severe stress, unspecified: Secondary | ICD-10-CM | POA: Diagnosis not present

## 2023-09-08 DIAGNOSIS — F439 Reaction to severe stress, unspecified: Secondary | ICD-10-CM | POA: Diagnosis not present

## 2023-09-15 DIAGNOSIS — F439 Reaction to severe stress, unspecified: Secondary | ICD-10-CM | POA: Diagnosis not present

## 2023-09-22 DIAGNOSIS — F439 Reaction to severe stress, unspecified: Secondary | ICD-10-CM | POA: Diagnosis not present

## 2023-10-08 DIAGNOSIS — F439 Reaction to severe stress, unspecified: Secondary | ICD-10-CM | POA: Diagnosis not present

## 2023-10-13 DIAGNOSIS — F439 Reaction to severe stress, unspecified: Secondary | ICD-10-CM | POA: Diagnosis not present

## 2023-10-20 DIAGNOSIS — F439 Reaction to severe stress, unspecified: Secondary | ICD-10-CM | POA: Diagnosis not present

## 2023-10-23 DIAGNOSIS — R1011 Right upper quadrant pain: Secondary | ICD-10-CM | POA: Diagnosis not present

## 2023-10-23 DIAGNOSIS — Z0000 Encounter for general adult medical examination without abnormal findings: Secondary | ICD-10-CM | POA: Diagnosis not present

## 2023-11-03 DIAGNOSIS — F439 Reaction to severe stress, unspecified: Secondary | ICD-10-CM | POA: Diagnosis not present

## 2023-11-06 DIAGNOSIS — R1011 Right upper quadrant pain: Secondary | ICD-10-CM | POA: Diagnosis not present

## 2023-11-10 DIAGNOSIS — F439 Reaction to severe stress, unspecified: Secondary | ICD-10-CM | POA: Diagnosis not present

## 2023-11-17 DIAGNOSIS — F439 Reaction to severe stress, unspecified: Secondary | ICD-10-CM | POA: Diagnosis not present

## 2023-11-24 DIAGNOSIS — F439 Reaction to severe stress, unspecified: Secondary | ICD-10-CM | POA: Diagnosis not present

## 2023-12-01 DIAGNOSIS — F439 Reaction to severe stress, unspecified: Secondary | ICD-10-CM | POA: Diagnosis not present

## 2023-12-04 DIAGNOSIS — R1011 Right upper quadrant pain: Secondary | ICD-10-CM | POA: Diagnosis not present

## 2023-12-04 DIAGNOSIS — Z136 Encounter for screening for cardiovascular disorders: Secondary | ICD-10-CM | POA: Diagnosis not present

## 2023-12-08 DIAGNOSIS — F439 Reaction to severe stress, unspecified: Secondary | ICD-10-CM | POA: Diagnosis not present

## 2023-12-15 DIAGNOSIS — F439 Reaction to severe stress, unspecified: Secondary | ICD-10-CM | POA: Diagnosis not present

## 2023-12-22 DIAGNOSIS — F439 Reaction to severe stress, unspecified: Secondary | ICD-10-CM | POA: Diagnosis not present

## 2023-12-29 DIAGNOSIS — F439 Reaction to severe stress, unspecified: Secondary | ICD-10-CM | POA: Diagnosis not present

## 2024-01-04 DIAGNOSIS — B9689 Other specified bacterial agents as the cause of diseases classified elsewhere: Secondary | ICD-10-CM | POA: Diagnosis not present

## 2024-01-04 DIAGNOSIS — J329 Chronic sinusitis, unspecified: Secondary | ICD-10-CM | POA: Diagnosis not present

## 2024-01-05 DIAGNOSIS — F439 Reaction to severe stress, unspecified: Secondary | ICD-10-CM | POA: Diagnosis not present

## 2024-01-12 DIAGNOSIS — F439 Reaction to severe stress, unspecified: Secondary | ICD-10-CM | POA: Diagnosis not present

## 2024-01-15 DIAGNOSIS — N898 Other specified noninflammatory disorders of vagina: Secondary | ICD-10-CM | POA: Diagnosis not present

## 2024-01-19 DIAGNOSIS — F439 Reaction to severe stress, unspecified: Secondary | ICD-10-CM | POA: Diagnosis not present

## 2024-02-09 DIAGNOSIS — F4312 Post-traumatic stress disorder, chronic: Secondary | ICD-10-CM | POA: Diagnosis not present

## 2024-02-16 DIAGNOSIS — F4312 Post-traumatic stress disorder, chronic: Secondary | ICD-10-CM | POA: Diagnosis not present

## 2024-03-01 DIAGNOSIS — F4312 Post-traumatic stress disorder, chronic: Secondary | ICD-10-CM | POA: Diagnosis not present

## 2024-03-08 DIAGNOSIS — F4312 Post-traumatic stress disorder, chronic: Secondary | ICD-10-CM | POA: Diagnosis not present

## 2024-03-15 DIAGNOSIS — F4312 Post-traumatic stress disorder, chronic: Secondary | ICD-10-CM | POA: Diagnosis not present

## 2024-03-22 DIAGNOSIS — F4312 Post-traumatic stress disorder, chronic: Secondary | ICD-10-CM | POA: Diagnosis not present

## 2024-04-05 DIAGNOSIS — F4312 Post-traumatic stress disorder, chronic: Secondary | ICD-10-CM | POA: Diagnosis not present

## 2024-04-12 DIAGNOSIS — F4312 Post-traumatic stress disorder, chronic: Secondary | ICD-10-CM | POA: Diagnosis not present

## 2024-04-19 DIAGNOSIS — F4312 Post-traumatic stress disorder, chronic: Secondary | ICD-10-CM | POA: Diagnosis not present

## 2024-04-19 DIAGNOSIS — R21 Rash and other nonspecific skin eruption: Secondary | ICD-10-CM | POA: Diagnosis not present

## 2024-04-26 DIAGNOSIS — F4312 Post-traumatic stress disorder, chronic: Secondary | ICD-10-CM | POA: Diagnosis not present

## 2024-05-10 DIAGNOSIS — F4312 Post-traumatic stress disorder, chronic: Secondary | ICD-10-CM | POA: Diagnosis not present

## 2024-07-04 DIAGNOSIS — F4312 Post-traumatic stress disorder, chronic: Secondary | ICD-10-CM | POA: Diagnosis not present

## 2024-07-08 DIAGNOSIS — M79622 Pain in left upper arm: Secondary | ICD-10-CM | POA: Diagnosis not present

## 2024-07-08 DIAGNOSIS — R1011 Right upper quadrant pain: Secondary | ICD-10-CM | POA: Diagnosis not present

## 2024-07-08 DIAGNOSIS — R0789 Other chest pain: Secondary | ICD-10-CM | POA: Diagnosis not present

## 2024-07-11 DIAGNOSIS — F4312 Post-traumatic stress disorder, chronic: Secondary | ICD-10-CM | POA: Diagnosis not present

## 2024-07-20 DIAGNOSIS — F4312 Post-traumatic stress disorder, chronic: Secondary | ICD-10-CM | POA: Diagnosis not present

## 2024-07-28 DIAGNOSIS — F4312 Post-traumatic stress disorder, chronic: Secondary | ICD-10-CM | POA: Diagnosis not present

## 2024-08-01 DIAGNOSIS — F4312 Post-traumatic stress disorder, chronic: Secondary | ICD-10-CM | POA: Diagnosis not present

## 2024-08-08 DIAGNOSIS — F4312 Post-traumatic stress disorder, chronic: Secondary | ICD-10-CM | POA: Diagnosis not present

## 2024-08-15 DIAGNOSIS — F4312 Post-traumatic stress disorder, chronic: Secondary | ICD-10-CM | POA: Diagnosis not present

## 2024-08-22 DIAGNOSIS — F4312 Post-traumatic stress disorder, chronic: Secondary | ICD-10-CM | POA: Diagnosis not present

## 2024-09-05 DIAGNOSIS — F4312 Post-traumatic stress disorder, chronic: Secondary | ICD-10-CM | POA: Diagnosis not present

## 2024-09-12 DIAGNOSIS — F4312 Post-traumatic stress disorder, chronic: Secondary | ICD-10-CM | POA: Diagnosis not present

## 2024-09-19 DIAGNOSIS — F4312 Post-traumatic stress disorder, chronic: Secondary | ICD-10-CM | POA: Diagnosis not present
